# Patient Record
Sex: Male | Born: 1970 | Race: Black or African American | Hispanic: No | Marital: Married | Smoking: Current some day smoker
Health system: Southern US, Community
[De-identification: ages and names within clinical notes are randomized; demographics above are authoritative.]

## PROBLEM LIST (undated history)

## (undated) DIAGNOSIS — E119 Type 2 diabetes mellitus without complications: Secondary | ICD-10-CM

## (undated) DIAGNOSIS — K219 Gastro-esophageal reflux disease without esophagitis: Secondary | ICD-10-CM

## (undated) DIAGNOSIS — I10 Essential (primary) hypertension: Secondary | ICD-10-CM

## (undated) DIAGNOSIS — T7840XA Allergy, unspecified, initial encounter: Secondary | ICD-10-CM

## (undated) DIAGNOSIS — E785 Hyperlipidemia, unspecified: Secondary | ICD-10-CM

## (undated) DIAGNOSIS — I639 Cerebral infarction, unspecified: Secondary | ICD-10-CM

## (undated) DIAGNOSIS — I6381 Other cerebral infarction due to occlusion or stenosis of small artery: Secondary | ICD-10-CM

## (undated) HISTORY — PX: FRACTURE SURGERY: SHX138

## (undated) HISTORY — DX: Hyperlipidemia, unspecified: E78.5

## (undated) HISTORY — DX: Allergy, unspecified, initial encounter: T78.40XA

## (undated) HISTORY — PX: TOE SURGERY: SHX1073

## (undated) HISTORY — DX: Other cerebral infarction due to occlusion or stenosis of small artery: I63.81

## (undated) HISTORY — DX: Cerebral infarction, unspecified: I63.9

## (undated) HISTORY — DX: Gastro-esophageal reflux disease without esophagitis: K21.9

## (undated) HISTORY — PX: SHOULDER SURGERY: SHX246

---

## 2015-04-09 ENCOUNTER — Encounter (HOSPITAL_COMMUNITY): Payer: Self-pay | Admitting: Emergency Medicine

## 2015-04-09 ENCOUNTER — Emergency Department (INDEPENDENT_AMBULATORY_CARE_PROVIDER_SITE_OTHER)
Admission: EM | Admit: 2015-04-09 | Discharge: 2015-04-09 | Disposition: A | Discharge status: Home / Self Care | Payer: Self-pay | Source: Home / Self Care

## 2015-04-09 DIAGNOSIS — Z760 Encounter for issue of repeat prescription: Secondary | ICD-10-CM

## 2015-04-09 MED ORDER — METFORMIN HCL 1000 MG PO TABS: 1000.0000 mg | ORAL_TABLET | Freq: Two times a day (BID) | ORAL | Status: DC

## 2015-04-09 NOTE — ED Notes (Signed)
Reports pt has been out of DM meds x2 weeks Feeling a little bit "weak" CBG last night was 348  A&O x4... No acute distress.

## 2015-04-09 NOTE — Discharge Instructions (Signed)
[facility] to ask about diabetic program  Call family medicine clinic at [facility]

## 2016-07-13 ENCOUNTER — Ambulatory Visit (HOSPITAL_COMMUNITY)
Admission: EM | Admit: 2016-07-13 | Discharge: 2016-07-13 | Disposition: A | Discharge status: Home / Self Care | Payer: BLUE CROSS/BLUE SHIELD | Attending: Internal Medicine | Admitting: Internal Medicine

## 2016-07-13 ENCOUNTER — Encounter (HOSPITAL_COMMUNITY): Payer: Self-pay | Admitting: Emergency Medicine

## 2016-07-13 DIAGNOSIS — R739 Hyperglycemia, unspecified: Secondary | ICD-10-CM

## 2016-07-13 DIAGNOSIS — M25561 Pain in right knee: Secondary | ICD-10-CM

## 2016-07-13 HISTORY — DX: Type 2 diabetes mellitus without complications: E11.9

## 2016-07-13 LAB — GLUCOSE, CAPILLARY: GLUCOSE-CAPILLARY: 354 mg/dL — AB (ref 65–99)

## 2016-07-13 MED ORDER — METFORMIN HCL 1000 MG PO TABS: 1000.0000 mg | ORAL_TABLET | Freq: Two times a day (BID) | ORAL | 3 refills | Status: DC

## 2016-07-13 MED ORDER — INSULIN GLARGINE 100 UNIT/ML ~~LOC~~ SOLN: 10.0000 [IU] | Freq: Every day | SUBCUTANEOUS | 1 refills | Status: DC

## 2016-07-13 NOTE — ED Provider Notes (Signed)
CSN: [id number]     Arrival date & time 07/13/16  1830 History   None    Chief Complaint  Patient presents with  . Knee Pain  . Hyperglycemia   (Consider location/radiation/quality/duration/timing/severity/associated sxs/prior Treatment) The history is provided by the patient. No language interpreter was used.  Knee Pain  Location:  Knee Injury: no   Knee location:  R knee Pain details:    Quality:  Aching   Radiates to:  Does not radiate   Severity:  Moderate   Onset quality:  Gradual   Duration:  1 week   Timing:  Constant   Progression:  Worsening Chronicity:  New Relieved by:  Nothing Worsened by:  Nothing Ineffective treatments:  None tried Hyperglycemia   Pt complains of knee pain and of being out of diabetes medicine.  Past Medical History:  Diagnosis Date  . Diabetes mellitus without complication [facility])    Past Surgical History:  Procedure Laterality Date  . FRACTURE SURGERY    . SHOULDER SURGERY Right    History reviewed. No pertinent family history. Social History  Substance Use Topics  . Smoking status: Current Every Day Smoker    Packs/day: 1.00    Types: Cigarettes  . Smokeless tobacco: Never Used  . Alcohol use No    Review of Systems  Allergies  Patient has no known allergies.  Home Medications   Prior to Admission medications   Medication Sig Start Date End Date Taking? Authorizing Provider  insulin glargine (LANTUS) 100 UNIT/ML injection Inject 0.1 mLs (10 Units total) into the skin at bedtime. 07/13/16   Fransico Meadow, PA-C  metFORMIN (GLUCOPHAGE) 1000 MG tablet Take 1 tablet (1,000 mg total) by mouth 2 (two) times daily. 07/13/16   Fransico Meadow, PA-C   Meds Ordered and Administered this Visit  Medications - No data to display  BP 115/76 (BP Location: Right Arm)   Pulse 75   Temp 98.2 F (36.8 C) (Oral)   Resp 14   SpO2 99%  No data found.   Physical Exam  Urgent Care Course     Procedures (including critical care  time)  Labs Review Labs Reviewed  GLUCOSE, CAPILLARY - Abnormal; Notable for the following:       Result Value   Glucose-Capillary 354 (*)    All other components within normal limits    Imaging Review No results found.   Visual Acuity Review  Right Eye Distance:   Left Eye Distance:   Bilateral Distance:    Right Eye Near:   Left Eye Near:    Bilateral Near:        Pt has elevated glucose.  Rx's to pt pharmacy.   Tylenol for knee pain.  Pt advise to call number on discharge instructions for referral to primary care MDM   1. Acute pain of right knee   2. Hyperglycemia    Meds ordered this encounter  Medications  . insulin glargine (LANTUS) 100 UNIT/ML injection    Sig: Inject 0.1 mLs (10 Units total) into the skin at bedtime.    Dispense:  10 mL    Refill:  1    Order Specific Question:   Supervising Provider    Answer:   Sherlene Shams [[id number]]  . metFORMIN (GLUCOPHAGE) 1000 MG tablet    Sig: Take 1 tablet (1,000 mg total) by mouth 2 (two) times daily.    Dispense:  60 tablet    Refill:  3    Order  Specific Question:   Supervising Provider    Answer:   Sherlene Shams [[id number]]  An After Visit Summary was printed and given to the patient.     Evening Shade, PA-C 07/14/16 [phone]

## 2016-07-13 NOTE — Discharge Instructions (Signed)
Return if any problems. Schedule primary care appointment

## 2016-07-13 NOTE — ED Triage Notes (Addendum)
The patient presented to the [facility] with a complaint of left knee pain and elevated blood sugars. The patient denied any known injury to his knee but stated that he is on his feet a lot at work. The patient reported that he has not had his insulin or metformin in the last 3 months.

## 2016-08-19 ENCOUNTER — Ambulatory Visit: Payer: BLUE CROSS/BLUE SHIELD | Admitting: Family

## 2016-09-08 ENCOUNTER — Emergency Department (HOSPITAL_COMMUNITY)
Admission: EM | Admit: 2016-09-08 | Discharge: 2016-09-08 | Disposition: A | Discharge status: Home / Self Care | Payer: BLUE CROSS/BLUE SHIELD | Attending: Emergency Medicine | Admitting: Emergency Medicine

## 2016-09-08 ENCOUNTER — Emergency Department (HOSPITAL_COMMUNITY): Payer: BLUE CROSS/BLUE SHIELD

## 2016-09-08 ENCOUNTER — Encounter (HOSPITAL_COMMUNITY): Payer: Self-pay

## 2016-09-08 DIAGNOSIS — R109 Unspecified abdominal pain: Secondary | ICD-10-CM | POA: Diagnosis not present

## 2016-09-08 DIAGNOSIS — R1011 Right upper quadrant pain: Secondary | ICD-10-CM | POA: Insufficient documentation

## 2016-09-08 DIAGNOSIS — R1031 Right lower quadrant pain: Secondary | ICD-10-CM | POA: Insufficient documentation

## 2016-09-08 DIAGNOSIS — Z7984 Long term (current) use of oral hypoglycemic drugs: Secondary | ICD-10-CM | POA: Insufficient documentation

## 2016-09-08 DIAGNOSIS — F17210 Nicotine dependence, cigarettes, uncomplicated: Secondary | ICD-10-CM | POA: Insufficient documentation

## 2016-09-08 DIAGNOSIS — E119 Type 2 diabetes mellitus without complications: Secondary | ICD-10-CM | POA: Diagnosis not present

## 2016-09-08 LAB — URINALYSIS, ROUTINE W REFLEX MICROSCOPIC
BACTERIA UA: NONE SEEN
BILIRUBIN URINE: NEGATIVE
Hgb urine dipstick: NEGATIVE
KETONES UR: NEGATIVE mg/dL
Leukocytes, UA: NEGATIVE
NITRITE: NEGATIVE
PH: 5 (ref 5.0–8.0)
PROTEIN: NEGATIVE mg/dL
Specific Gravity, Urine: 1.032 — ABNORMAL HIGH (ref 1.005–1.030)

## 2016-09-08 LAB — COMPREHENSIVE METABOLIC PANEL
ALBUMIN: 3.8 g/dL (ref 3.5–5.0)
ALT: 13 U/L — ABNORMAL LOW (ref 17–63)
ANION GAP: 6 (ref 5–15)
AST: 16 U/L (ref 15–41)
Alkaline Phosphatase: 82 U/L (ref 38–126)
BILIRUBIN TOTAL: 0.5 mg/dL (ref 0.3–1.2)
BUN: 10 mg/dL (ref 6–20)
CHLORIDE: 104 mmol/L (ref 101–111)
CO2: 26 mmol/L (ref 22–32)
Calcium: 9.2 mg/dL (ref 8.9–10.3)
Creatinine, Ser: 0.85 mg/dL (ref 0.61–1.24)
GFR calc Af Amer: >60 mL/min (ref >60)
GFR calc non Af Amer: >60 mL/min (ref >60)
GLUCOSE: 268 mg/dL — AB (ref 65–99)
Potassium: 4.2 mmol/L (ref 3.5–5.1)
SODIUM: 136 mmol/L (ref 135–145)
TOTAL PROTEIN: 6.8 g/dL (ref 6.5–8.1)

## 2016-09-08 LAB — CBC WITH DIFFERENTIAL/PLATELET
BASOS ABS: 0 10*3/uL (ref 0.0–0.1)
Basophils Relative: 0 %
EOS ABS: 0.3 10*3/uL (ref 0.0–0.7)
EOS PCT: 3 %
HEMATOCRIT: 43.1 % (ref 39.0–52.0)
Hemoglobin: 15.1 g/dL (ref 13.0–17.0)
Lymphocytes Relative: 40 %
Lymphs Abs: 3.8 10*3/uL (ref 0.7–4.0)
MCH: 30.2 pg (ref 26.0–34.0)
MCHC: 35 g/dL (ref 30.0–36.0)
MCV: 86.2 fL (ref 78.0–100.0)
MONO ABS: 0.4 10*3/uL (ref 0.1–1.0)
MONOS PCT: 5 %
NEUTROS ABS: 5.1 10*3/uL (ref 1.7–7.7)
Neutrophils Relative %: 52 %
PLATELETS: 315 10*3/uL (ref 150–400)
RBC: 5 MIL/uL (ref 4.22–5.81)
RDW: 12.6 % (ref 11.5–15.5)
WBC: 9.7 10*3/uL (ref 4.0–10.5)

## 2016-09-08 MED ORDER — SODIUM CHLORIDE 0.9 % IV BOLUS (SEPSIS)
500.0000 mL | Freq: Once | INTRAVENOUS | Status: AC
  Administered 2016-09-08: 500 mL via INTRAVENOUS

## 2016-09-08 MED ORDER — SODIUM CHLORIDE 0.9 % IV BOLUS (SEPSIS): 1000.0000 mL | Freq: Once | INTRAVENOUS | Status: DC

## 2016-09-08 NOTE — ED Triage Notes (Signed)
Pt reports right flank pain that started last night. He denies any trouble urinating. He states the pain to his flank area subsides when he moves his genitals around. He also reports numbness to left hand which started today at 2pm.

## 2016-09-08 NOTE — Discharge Instructions (Signed)
As discussed, follow-up with your primary care provider at your upcoming appointment.  Your CT scan did not show any acute abnormalities today. Your exam and labs are reassuring. It is possible that he may have pulled a muscle. Take ibuprofen or naproxen for pain and do some gentle stretching. Return if symptoms worsen or if you experience new concerning symptoms in the meantime.

## 2016-09-08 NOTE — ED Notes (Signed)
Pt departed in NAD, refused use of wheelchair.  

## 2016-09-08 NOTE — ED Notes (Signed)
Patient transported to CT 

## 2016-09-08 NOTE — ED Provider Notes (Signed)
Barry Phillips Provider Note   CSN: [id number] Arrival date & time: 09/08/16  1749     History   Chief Complaint Chief Complaint  Patient presents with  . Flank Pain    HPI Barry Phillips is a 46 y.o. male presenting with sudden onset achy right flank pain starting yesterday at work that has been intermittent since. He has not tried anything for the pain. The pain is nonradiating, it is aggravated by movement. He states that he has gotten relief from moving his genitals at times. He does report a history of nephrolithiasis 18 years ago, but the pain was much more severe than it is today. He is currently pain-free unless he moves. He states that he does not have a PCP but will have one starting in July. He is currently noncompliant with insulin as he can't afford to purchase it. He states that he takes 2000 mg of metformin a day. Denies dysuria, hematuria, decreased urination, testicular pain, swelling, discharge, fever, chills, nausea, vomiting, diarrhea or other symptoms.  HPI  Past Medical History:  Diagnosis Date  . Diabetes mellitus without complication ([place])     There are no active problems to display for this patient.   Past Surgical History:  Procedure Laterality Date  . FRACTURE SURGERY    . SHOULDER SURGERY Right        Home Medications    Prior to Admission medications   Medication Sig Start Date End Date Taking? Authorizing Provider  aspirin 81 MG chewable tablet Chew 81 mg by mouth daily.   Yes [provider]  metFORMIN (GLUCOPHAGE) 1000 MG tablet Take 1 tablet (1,000 mg total) by mouth 2 (two) times daily. 07/13/16  Yes Caryl Ada K, PA-C  insulin glargine (LANTUS) 100 UNIT/ML injection Inject 0.1 mLs (10 Units total) into the skin at bedtime. Patient not taking: Reported on 09/08/2016 07/13/16   Barry Phillips    Family History No family history on file.  Social History Social History  Substance Use Topics  . Smoking status:  Current Every Day Smoker    Packs/day: 1.00    Types: Cigarettes  . Smokeless tobacco: Never Used  . Alcohol use No     Allergies   Patient has no known allergies.   Review of Systems Review of Systems  Constitutional: Negative for activity change, appetite change, chills and fever.  HENT: Negative for ear pain and sore throat.   Eyes: Negative for pain and visual disturbance.  Respiratory: Negative for cough, shortness of breath, wheezing and stridor.   Cardiovascular: Negative for chest pain and palpitations.  Gastrointestinal: Negative for abdominal distention, abdominal pain, blood in stool, diarrhea, nausea and vomiting.  Genitourinary: Positive for flank pain. Negative for decreased urine volume, difficulty urinating, discharge, dysuria, frequency, hematuria, penile pain, penile swelling, scrotal swelling and testicular pain.  Musculoskeletal: Negative for arthralgias, back pain, gait problem, neck pain and neck stiffness.  Skin: Negative for color change, pallor and rash.  Neurological: Negative for dizziness, seizures, syncope, weakness, light-headedness and headaches.     Physical Exam Updated Vital Signs BP 125/86   Pulse 69   Temp 97.8 F (36.6 C) (Oral)   Resp 16   SpO2 100%   Physical Exam  Constitutional: He appears well-developed and well-nourished. No distress.  Afebrile, nontoxic-appearing, lying comfortably in bed in no acute distress.  HENT:  Head: Normocephalic and atraumatic.  Eyes: Conjunctivae and EOM are normal. Right eye exhibits no discharge. Left eye exhibits no discharge.  No scleral icterus.  Neck: Normal range of motion.  Cardiovascular: Normal rate, regular rhythm and normal heart sounds.   No murmur heard. Pulmonary/Chest: Effort normal and breath sounds normal. No respiratory distress. He has no wheezes. He has no rales. He exhibits no tenderness.  Abdominal: Soft. He exhibits no distension and no mass. There is no tenderness. There is no  guarding.  No CVA tenderness. Flat contour, active bowel sounds. abdomen is supple and non-tender to both light and deep palpation and no rebound tenderness. Some reproducible TTP of right flank. No palpated masses. Negative murphy's sign Negative McBurney's point tenderness   Musculoskeletal: Normal range of motion. He exhibits no edema.  Neurological: He is alert.  Skin: Skin is warm and dry. No rash noted. He is not diaphoretic. No erythema. No pallor.  Psychiatric: He has a normal mood and affect.  Nursing note and vitals reviewed.    ED Treatments / Results  Labs (all labs ordered are listed, but only abnormal results are displayed) Labs Reviewed  URINALYSIS, ROUTINE W REFLEX MICROSCOPIC - Abnormal; Notable for the following:       Result Value   Specific Gravity, Urine 1.032 (*)    Glucose, UA >=500 (*)    Squamous Epithelial / LPF 0-5 (*)    All other components within normal limits  COMPREHENSIVE METABOLIC PANEL - Abnormal; Notable for the following:    Glucose, Bld 268 (*)    ALT 13 (*)    All other components within normal limits  CBC WITH DIFFERENTIAL/PLATELET    EKG  EKG Interpretation None       Radiology Ct Renal Stone Study  Result Date: 09/08/2016 CLINICAL DATA:  46 year old male with acute right flank and abdominal pain for 1 day. EXAM: CT ABDOMEN AND PELVIS WITHOUT CONTRAST TECHNIQUE: Multidetector CT imaging of the abdomen and pelvis was performed following the standard protocol without IV contrast. COMPARISON:  None. FINDINGS: Please note that parenchymal abnormalities may be missed without intravenous contrast. Lower chest: No acute abnormality Hepatobiliary: The liver and gallbladder are unremarkable. There is no evidence of biliary dilatation. Pancreas: Unremarkable Spleen: Unremarkable Adrenals/Urinary Tract: The kidneys, adrenal glands and bladder are unremarkable. Stomach/Bowel: Stomach is within normal limits. Appendix appears normal. No evidence of  bowel wall thickening, distention, or inflammatory changes. Vascular/Lymphatic: No significant vascular findings are present. No enlarged abdominal or pelvic lymph nodes. Reproductive: Prostate is unremarkable. Other: No ascites, focal collection or pneumoperitoneum. Musculoskeletal: No acute or significant osseous findings. IMPRESSION: No acute or significant abnormalities. Electronically Signed   By: Margarette Canada M.D.   On: 09/08/2016 22:12    Procedures Procedures (including critical care time)  Medications Ordered in ED Medications  sodium chloride 0.9 % bolus 500 mL (500 mLs Intravenous New Bag/Given 09/08/16 2242)     Initial Impression / Assessment and Plan / ED Course  I have reviewed the triage vital signs and the nursing notes.  Pertinent labs & imaging results that were available during my care of the patient were reviewed by me and considered in my medical decision making (see chart for details).    Patient presents with right flank pain. Known history of remote nephrolithiasis although pain is less severe at this time.  Suspect he could be experiencing ureter spasm from a small stone Ordered CT renal. Abdomen is otherwise benign. No associated symptoms.   Reassuring exam, no cva tenderness, afebrile, nontoxic.  He is non-compliant with insulin and his blood glucose is poorly managed, no ketones  or gap.  Will give him IV fluid bolus, and reassess.  On reassessment, patient was sleeping comfortably and reported feeling well.  CT without acute abnormalities. Discussed negative CT findings and urged to follow up with PCP for diabetes management.  Discussed strict return precautions and advised to return to the emergency department if experiencing any new or worsening symptoms. Instructions were understood and patient agreed with discharge plan. Final Clinical Impressions(s) / ED Diagnoses   Final diagnoses:  Right flank pain    New Prescriptions New Prescriptions   No  medications on file     Dossie Der 09/08/16 2252    Fredia Sorrow, MD 09/13/16 [phone]

## 2016-09-18 ENCOUNTER — Encounter: Payer: Self-pay | Admitting: Family

## 2016-09-18 ENCOUNTER — Other Ambulatory Visit (INDEPENDENT_AMBULATORY_CARE_PROVIDER_SITE_OTHER): Payer: BLUE CROSS/BLUE SHIELD

## 2016-09-18 ENCOUNTER — Ambulatory Visit (INDEPENDENT_AMBULATORY_CARE_PROVIDER_SITE_OTHER): Payer: BLUE CROSS/BLUE SHIELD | Admitting: Family

## 2016-09-18 VITALS — BP 122/86 | HR 86 | Temp 98.1°F | Resp 16 | Ht 75.0 in | Wt 202.0 lb

## 2016-09-18 DIAGNOSIS — E1142 Type 2 diabetes mellitus with diabetic polyneuropathy: Secondary | ICD-10-CM | POA: Diagnosis not present

## 2016-09-18 DIAGNOSIS — E119 Type 2 diabetes mellitus without complications: Secondary | ICD-10-CM | POA: Insufficient documentation

## 2016-09-18 DIAGNOSIS — D229 Melanocytic nevi, unspecified: Secondary | ICD-10-CM | POA: Diagnosis not present

## 2016-09-18 LAB — MICROALBUMIN / CREATININE URINE RATIO
Creatinine,U: 71.1 mg/dL
MICROALB/CREAT RATIO: 1 mg/g (ref 0.0–30.0)
Microalb, Ur: 0.7 mg/dL (ref 0.0–1.9)

## 2016-09-18 LAB — HEMOGLOBIN A1C: HEMOGLOBIN A1C: 14 % — AB (ref 4.6–6.5)

## 2016-09-18 MED ORDER — ERTUGLIFLOZIN L-PYROGLUTAMICAC 5 MG PO TABS: 5.0000 mg | ORAL_TABLET | Freq: Every day | ORAL | 0 refills | Status: DC

## 2016-09-18 MED ORDER — ERTUGLIFLOZIN L-PYROGLUTAMICAC 5 MG PO TABS: 1.0000 | ORAL_TABLET | Freq: Every day | ORAL | 2 refills | Status: DC

## 2016-09-18 NOTE — Patient Instructions (Signed)
Thank you for choosing [facility].  SUMMARY AND INSTRUCTIONS:  We will check your blood work today.   Please continue to take your metformin as prescribed.  Monitor your blood sugars at home.   Start the [employer]   Monitor the mole on your leg.   Follow up in 3 months or sooner.  Medication:  Your prescription(s) have been submitted to your pharmacy or been printed and provided for you. Please take as directed and contact our office if you believe you are having problem(s) with the medication(s) or have any questions.  Labs:  Please stop by the lab on the lower level of the building for your blood work. Your results will be released to [place] (or called to you) after review, usually within 72 hours after test completion. If any changes need to be made, you will be notified at that same time.  1.) The lab is open from 7:30am to 5:30 pm Monday-Friday 2.) No appointment is necessary 3.) Fasting (if needed) is 6-8 hours after food and drink; black coffee and water are okay   Follow up:  If your symptoms worsen or fail to improve, please contact our office for further instruction, or in case of emergency go directly to the emergency room at the closest medical facility.

## 2016-09-18 NOTE — Progress Notes (Signed)
Subjective:    Patient ID: Barry Phillips, male    DOB: May 19, 1970, 46 y.o.   MRN: [id number]  Chief Complaint  Patient presents with  . Establish Care    diabetes check and would like to see about cheaper insulin, spot on left leg     HPI:  Barry Phillips is a 46 y.o. male who  has a past medical history of Diabetes mellitus without complication ([place]). and presents today for an office visit to establish care.  1.) Type 2 diabetes - Currently prescribed metformin and Lantus. Reports Reports taking the metformin as prescribed and has been out of Lantus for the last year. No adverse side effects or hypoglycemic readings. Blood sugars at home have been ranging from 200-300. Denies new symptoms of end organ damage. Does have some numbness and tingling located in his feet. No excessive hunger, thirst, or urination. Not currently following a low carbohydrate. Last eye exam was about 1 year ago.   Lab Results  Component Value Date   HGBA1C 14.0 (H) 09/18/2016     Lab Results  Component Value Date   CREATININE 0.85 09/08/2016   BUN 10 09/08/2016   NA 136 09/08/2016   K 4.2 09/08/2016   CL 104 09/08/2016   CO2 26 09/08/2016    2.) Spot on leg - This is a new problem. Associated symptom of a lesion located on medial aspect of the left thigh that has been going on for about 10 years and has concerns that it recently changed in size. No pain or discharge from the site.     No Known Allergies    Outpatient Medications Prior to Visit  Medication Sig Dispense Refill  . aspirin 81 MG chewable tablet Chew 81 mg by mouth daily.    . metFORMIN (GLUCOPHAGE) 1000 MG tablet Take 1 tablet (1,000 mg total) by mouth 2 (two) times daily. 60 tablet 3  . insulin glargine (LANTUS) 100 UNIT/ML injection Inject 0.1 mLs (10 Units total) into the skin at bedtime. 10 mL 1   No facility-administered medications prior to visit.      Past Medical History:  Diagnosis Date  . Diabetes mellitus without  complication [facility])       Past Surgical History:  Procedure Laterality Date  . FRACTURE SURGERY    . SHOULDER SURGERY Right       Family History  Problem Relation Age of Onset  . Diabetes Father       Social History   Social History  . Marital status: Married    Spouse name: N/A  . Number of children: 3  . Years of education: GED   Occupational History  . Not on file.   Social History Main Topics  . Smoking status: Current Every Day Smoker    Packs/day: 1.00    Years: 19.00    Types: Cigarettes  . Smokeless tobacco: Never Used  . Alcohol use Yes     Comment: Social  . Drug use: No  . Sexual activity: Not on file   Other Topics Concern  . Not on file   Social History Narrative   Fun/Hobby: Play with his daughters and family time.     Review of Systems  Eyes:       Negative for changes in vision.  Respiratory: Negative for chest tightness and shortness of breath.   Cardiovascular: Negative for chest pain, palpitations and leg swelling.  Endocrine: Negative for polydipsia, polyphagia and polyuria.  Neurological: Negative for dizziness,  weakness, light-headedness and headaches.       Objective:    BP 122/86 (BP Location: Left Arm, Patient Position: Sitting, Cuff Size: Large)   Pulse 86   Temp 98.1 F (36.7 C) (Oral)   Resp 16   Ht 6\' 3"  (1.905 m)   Wt 202 lb (91.6 kg)   SpO2 98%   BMI 25.25 kg/m  Nursing note and vital signs reviewed.  Physical Exam  Constitutional: He is oriented to person, place, and time. He appears well-developed and well-nourished. No distress.  Cardiovascular: Normal rate, regular rhythm, normal heart sounds and intact distal pulses.   Pulmonary/Chest: Effort normal and breath sounds normal.  Neurological: He is alert and oriented to person, place, and time.  Diabetic Foot Exam - Simple   Simple Foot Form Diabetic Foot exam was performed with the following findings:  Yes  09/18/2016  9:54 AM  Visual Inspection No  deformities, no ulcerations, no other skin breakdown bilaterally:  Yes Sensation Testing See comments:  Yes Pulse Check Posterior Tibialis and Dorsalis pulse intact bilaterally:  Yes Comments Significant decreased sensation on the plantar aspect of the feet  bilaterally to monofilament. Intact to light touch.    Skin: Skin is warm and dry.  Small brown and annular nevi located on the left inner thigh with distinct borders and no tenderness.   Psychiatric: He has a normal mood and affect. His behavior is normal. Judgment and thought content normal.        Assessment & Plan:   Problem List Items Addressed This Visit      Endocrine   Type 2 diabetes mellitus ([place]) - Primary    Obtain hemoglobin A1c and urine microalbumin. Continue current dosage of metformin. Start Steglatro and discontinue Lantus. Encouraged to monitor blood sugars at home 1-2 times daily. Diabetic foot exam completed today. Encouraged to complete diabetic eye exam independently. Not currently maintained on statin/Ace or ARB for CAD risk reduction. Encouraged to follow a carbohydrate modify diet. Continue to monitor pending A1c results.      Relevant Medications   Ertugliflozin L-PyroglutamicAc (STEGLATRO) 5 MG TABS   Ertugliflozin L-PyroglutamicAc (STEGLATRO) 5 MG TABS   Other Relevant Orders   Hemoglobin A1c (Completed)   Urine Microalbumin w/creat. ratio (Completed)     Musculoskeletal and Integument   Benign skin mole    Benign skin mole noted on left inner thigh with little concern for malignancy. Continue to monitor and consider biopsy if changing in size.          I have discontinued Mr. Funchess's insulin glargine. I am also having him start on Ertugliflozin L-PyroglutamicAc and Ertugliflozin L-PyroglutamicAc. Additionally, I am having him maintain his metFORMIN and aspirin.   Meds ordered this encounter  Medications  . Ertugliflozin L-PyroglutamicAc (STEGLATRO) 5 MG TABS    Sig: Take 5 mg by mouth  daily.    Dispense:  14 tablet    Refill:  0    Order Specific Question:   Supervising Provider    Answer:   Pricilla Holm A [[id number]]  . Ertugliflozin L-PyroglutamicAc (STEGLATRO) 5 MG TABS    Sig: Take 1 tablet by mouth daily.    Dispense:  30 tablet    Refill:  2    Order Specific Question:   Supervising Provider    Answer:   Pricilla Holm A [[id number]]     Follow-up: Return in about 3 months (around 12/19/2016).  Mauricio Po, FNP

## 2016-09-18 NOTE — Assessment & Plan Note (Signed)
Benign skin mole noted on left inner thigh with little concern for malignancy. Continue to monitor and consider biopsy if changing in size.

## 2016-09-18 NOTE — Assessment & Plan Note (Signed)
Obtain hemoglobin A1c and urine microalbumin. Continue current dosage of metformin. Start Steglatro and discontinue Lantus. Encouraged to monitor blood sugars at home 1-2 times daily. Diabetic foot exam completed today. Encouraged to complete diabetic eye exam independently. Not currently maintained on statin/Ace or ARB for CAD risk reduction. Encouraged to follow a carbohydrate modify diet. Continue to monitor pending A1c results.

## 2017-01-29 ENCOUNTER — Ambulatory Visit: Payer: BLUE CROSS/BLUE SHIELD | Admitting: Family Medicine

## 2017-02-19 ENCOUNTER — Encounter: Payer: Self-pay | Admitting: Family Medicine

## 2017-02-19 ENCOUNTER — Ambulatory Visit: Payer: BLUE CROSS/BLUE SHIELD | Admitting: Family Medicine

## 2017-02-19 VITALS — BP 142/84 | HR 80 | Temp 98.6°F | Ht 75.0 in | Wt 208.0 lb

## 2017-02-19 DIAGNOSIS — Z794 Long term (current) use of insulin: Secondary | ICD-10-CM | POA: Diagnosis not present

## 2017-02-19 DIAGNOSIS — M2142 Flat foot [pes planus] (acquired), left foot: Secondary | ICD-10-CM

## 2017-02-19 DIAGNOSIS — M2140 Flat foot [pes planus] (acquired), unspecified foot: Secondary | ICD-10-CM | POA: Insufficient documentation

## 2017-02-19 DIAGNOSIS — E1142 Type 2 diabetes mellitus with diabetic polyneuropathy: Secondary | ICD-10-CM | POA: Diagnosis not present

## 2017-02-19 DIAGNOSIS — M2141 Flat foot [pes planus] (acquired), right foot: Secondary | ICD-10-CM

## 2017-02-19 DIAGNOSIS — G629 Polyneuropathy, unspecified: Secondary | ICD-10-CM | POA: Insufficient documentation

## 2017-02-19 LAB — HEMOGLOBIN A1C: Hgb A1c MFr Bld: 13.2 % — ABNORMAL HIGH (ref 4.6–6.5)

## 2017-02-19 MED ORDER — GABAPENTIN 100 MG PO CAPS: 100.0000 mg | ORAL_CAPSULE | Freq: Three times a day (TID) | ORAL | 3 refills | Status: DC

## 2017-02-19 NOTE — Assessment & Plan Note (Signed)
Would likely benefit from orthosis. Would avoid anything rigid as to avoid any ulcer formation with his neuropathy. - Counseled on which over-the-counter orthotics to try. - Could consider custom orthotics

## 2017-02-19 NOTE — Patient Instructions (Signed)
Thank you for coming in,   Please take the gabapentin one time daily. If you tolerate this medication then you can increase it to three time per day.   We will call you with the results from today.    Please feel free to call with any questions or concerns at any time, at [phone]. --Dr. Raeford Razor  Diet Recommendations for Diabetes   Starchy (carb) foods include: Bread, rice, pasta, potatoes, corn, crackers, bagels, muffins, all baked goods.   Protein foods include: Meat, fish, poultry, eggs, dairy foods, and beans such as pinto and kidney beans (beans also provide carbohydrate).   1. Eat at least 3 meals and 1-2 snacks per day. Never go more than 4-5 hours while awake without eating.  2. Limit starchy foods to TWO per meal and ONE per snack. ONE portion of a starchy  food is equal to the following:   - ONE slice of bread (or its equivalent, such as half of a hamburger bun).   - 1/2 cup of a "scoopable" starchy food such as potatoes or rice.   - 1 OUNCE (28 grams) of starchy snack foods such as crackers or pretzels (look on label).   - 15 grams of carbohydrate as shown on food label.  3. Both lunch and dinner should include a protein food, a carb food, and vegetables.   - Obtain twice as many veg's as protein or carbohydrate foods for both lunch and dinner.   - Try to keep frozen veg's on hand for a quick vegetable serving.     - Fresh or frozen veg's are best.  4. Breakfast should always include protein.

## 2017-02-19 NOTE — Assessment & Plan Note (Addendum)
Currently uncontrolled. Is only currently taking metformin. Reports having intolerance to [facility] Has been diagnosed since 2008. - Referral to endocrinology. - Referral to diabetic education - Hemoglobin A1c today. - Counseled on diabetic diet

## 2017-02-19 NOTE — Assessment & Plan Note (Signed)
Likely related to his uncontrolled diabetes. - Counseled on blood sugar management - Gabapentin initiated

## 2017-02-19 NOTE — Progress Notes (Signed)
Barry Phillips - 46 y.o. male MRN [id number]  Date of birth: 10-04-1970  SUBJECTIVE:  Including CC & ROS.  Chief Complaint  Patient presents with  . Foot Pain    bilateral    Barry Phillips is a 46 y.o. male that is  Here for an evaluation bilateral feet pain. He states his left foot is worse than his right. The pain is located on the medial aspect of his left foot. He has constant numbness. He is up on his feet a lot and standing on concrete flooring at work.  Denies injury or prior surgeries to the area. He has not taken or tried anything for the pain.  Reports he has always had flat feet but they seem to be getting worse. He has to stand for long periods of time which seemed to make his pain worse. The pain is a throbbing and stinging sensation. It is mild to moderate in nature. He has not had any injury or inciting event. Pain seems to be staying the same. The numbness seems to be getting worse.  Reports being diagnosed with diabetes 2008. He was taking Steglatro, but reports having erectile dysfunction type symptoms. Is currently taking metformin. Was unable to continue insulin due to financial considerations.   Review his hemoglobin A1c from July of this sheers shows no elevation at 14.   Review of Systems  Constitutional: Negative for fever.  Respiratory: Negative for shortness of breath.   Cardiovascular: Negative for chest pain.  Musculoskeletal: Negative for gait problem.  Skin: Negative for color change.  Neurological: Positive for numbness. Negative for weakness.  Hematological: Negative for adenopathy.    HISTORY: Past Medical, Surgical, Social, and Family History Reviewed & Updated per EMR.   Pertinent Historical Findings include:  Past Medical History:  Diagnosis Date  . Diabetes mellitus without complication [facility])     Past Surgical History:  Procedure Laterality Date  . FRACTURE SURGERY    . SHOULDER SURGERY Right     No Known Allergies  Family History    Problem Relation Age of Onset  . Diabetes Father      Social History   Socioeconomic History  . Marital status: Married    Spouse name: Not on file  . Number of children: 3  . Years of education: GED  . Highest education level: Not on file  Social Needs  . Financial resource strain: Not on file  . Food insecurity - worry: Not on file  . Food insecurity - inability: Not on file  . Transportation needs - medical: Not on file  . Transportation needs - non-medical: Not on file  Occupational History  . Not on file  Tobacco Use  . Smoking status: Current Every Day Smoker    Packs/day: 1.00    Years: 19.00    Pack years: 19.00    Types: Cigarettes  . Smokeless tobacco: Never Used  Substance and Sexual Activity  . Alcohol use: Yes    Comment: Social  . Drug use: No  . Sexual activity: Not on file  Other Topics Concern  . Not on file  Social History Narrative   Fun/Hobby: Play with his daughters and family time.      PHYSICAL EXAM:  VS: BP (!) 142/84 (BP Location: Left Arm, Patient Position: Sitting, Cuff Size: Normal)   Pulse 80   Temp 98.6 F (37 C) (Oral)   Ht 6\' 3"  (1.905 m)   Wt 208 lb (94.3 kg)   SpO2 100%  BMI 26.00 kg/m  Physical Exam Gen: NAD, alert, cooperative with exam, well-appearing ENT: normal lips, normal nasal mucosa,  Eye: normal EOM, normal conjunctiva and lids CV:  no edema, +2 pedal pulses   Resp: no accessory muscle use, non-labored,  Skin: no rashes, no areas of induration  Neuro: normal tone, normal sensation to touch Psych:  normal insight, alert and oriented MSK:  Right and left foot. Significant pes planus bilaterally. Possible transverse arch with hammertoe formation. No ulcers on the plantar aspect. Walks on the medial aspect of his feet.  Monofilament testing Some loss of sensation over the left lateral aspect of the great toe and fourth digit. Some loss of sensation over the lateral forefoot on the left foot  Right foot  with more preserved sensation in all areas compared to the left     ASSESSMENT & PLAN:   Type 2 diabetes mellitus ([place]) Currently uncontrolled. Is only currently taking metformin. Reports having intolerance to [facility]. Has been diagnosed since 2008. - Referral to endocrinology. - Referral to diabetic education - Hemoglobin A1c today. - Counseled on diabetic diet   Polyneuropathy Likely related to his uncontrolled diabetes. - Counseled on blood sugar management - Gabapentin initiated  Flat foot Would likely benefit from orthosis. Would avoid anything rigid as to avoid any ulcer formation with his neuropathy. - Counseled on which over-the-counter orthotics to try. - Could consider custom orthotics

## 2017-02-24 ENCOUNTER — Telehealth: Payer: Self-pay | Admitting: Family Medicine

## 2017-02-24 NOTE — Telephone Encounter (Signed)
Left VM for patient. If he calls back please have him speak with a nurse/CMA and inform that his A1c is still elevated and we would need to use some form of insulin in order to bring it down.   If any questions then please take the best time and phone number to call and I will try to call him back.   Rosemarie Ax, MD [facility] 02/24/2017, 9:59 AM

## 2017-03-19 ENCOUNTER — Ambulatory Visit: Payer: BLUE CROSS/BLUE SHIELD | Admitting: *Deleted

## 2017-04-01 ENCOUNTER — Emergency Department (HOSPITAL_COMMUNITY)
Admission: EM | Admit: 2017-04-01 | Discharge: 2017-04-01 | Disposition: A | Discharge status: Home / Self Care | Payer: BLUE CROSS/BLUE SHIELD | Attending: Emergency Medicine | Admitting: Emergency Medicine

## 2017-04-01 ENCOUNTER — Other Ambulatory Visit: Payer: Self-pay

## 2017-04-01 ENCOUNTER — Encounter (HOSPITAL_COMMUNITY): Payer: Self-pay | Admitting: Emergency Medicine

## 2017-04-01 DIAGNOSIS — E1165 Type 2 diabetes mellitus with hyperglycemia: Secondary | ICD-10-CM | POA: Diagnosis not present

## 2017-04-01 DIAGNOSIS — Z7982 Long term (current) use of aspirin: Secondary | ICD-10-CM | POA: Diagnosis not present

## 2017-04-01 DIAGNOSIS — Z9114 Patient's other noncompliance with medication regimen: Secondary | ICD-10-CM | POA: Diagnosis not present

## 2017-04-01 DIAGNOSIS — K0889 Other specified disorders of teeth and supporting structures: Secondary | ICD-10-CM

## 2017-04-01 DIAGNOSIS — Z7984 Long term (current) use of oral hypoglycemic drugs: Secondary | ICD-10-CM | POA: Insufficient documentation

## 2017-04-01 DIAGNOSIS — K089 Disorder of teeth and supporting structures, unspecified: Secondary | ICD-10-CM | POA: Diagnosis not present

## 2017-04-01 DIAGNOSIS — Z79899 Other long term (current) drug therapy: Secondary | ICD-10-CM | POA: Insufficient documentation

## 2017-04-01 DIAGNOSIS — R350 Frequency of micturition: Secondary | ICD-10-CM | POA: Diagnosis not present

## 2017-04-01 DIAGNOSIS — F17210 Nicotine dependence, cigarettes, uncomplicated: Secondary | ICD-10-CM | POA: Diagnosis not present

## 2017-04-01 DIAGNOSIS — R631 Polydipsia: Secondary | ICD-10-CM | POA: Diagnosis not present

## 2017-04-01 DIAGNOSIS — R739 Hyperglycemia, unspecified: Secondary | ICD-10-CM | POA: Diagnosis not present

## 2017-04-01 LAB — URINALYSIS, COMPLETE (UACMP) WITH MICROSCOPIC
BACTERIA UA: NONE SEEN
BILIRUBIN URINE: NEGATIVE
Glucose, UA: >=500 mg/dL — AB
Hgb urine dipstick: NEGATIVE
KETONES UR: NEGATIVE mg/dL
LEUKOCYTES UA: NEGATIVE
NITRITE: NEGATIVE
PH: 5 (ref 5.0–8.0)
Protein, ur: NEGATIVE mg/dL
RBC / HPF: NONE SEEN RBC/hpf (ref 0–5)
Specific Gravity, Urine: 1.03 (ref 1.005–1.030)
Squamous Epithelial / LPF: NONE SEEN

## 2017-04-01 LAB — COMPREHENSIVE METABOLIC PANEL
ALT: 24 U/L (ref 17–63)
ANION GAP: 10 (ref 5–15)
AST: 20 U/L (ref 15–41)
Albumin: 3.8 g/dL (ref 3.5–5.0)
Alkaline Phosphatase: 105 U/L (ref 38–126)
BUN: 15 mg/dL (ref 6–20)
CALCIUM: 9.4 mg/dL (ref 8.9–10.3)
CHLORIDE: 100 mmol/L — AB (ref 101–111)
CO2: 24 mmol/L (ref 22–32)
Creatinine, Ser: 1.03 mg/dL (ref 0.61–1.24)
GFR calc Af Amer: >60 mL/min (ref >60)
GFR calc non Af Amer: >60 mL/min (ref >60)
Glucose, Bld: 562 mg/dL (ref 65–99)
POTASSIUM: 4.7 mmol/L (ref 3.5–5.1)
SODIUM: 134 mmol/L — AB (ref 135–145)
Total Bilirubin: 1 mg/dL (ref 0.3–1.2)
Total Protein: 7 g/dL (ref 6.5–8.1)

## 2017-04-01 LAB — CBC WITH DIFFERENTIAL/PLATELET
Basophils Absolute: 0 10*3/uL (ref 0.0–0.1)
Basophils Relative: 0 %
EOS ABS: 0.2 10*3/uL (ref 0.0–0.7)
EOS PCT: 2 %
HCT: 43.4 % (ref 39.0–52.0)
Hemoglobin: 15.3 g/dL (ref 13.0–17.0)
LYMPHS ABS: 3.1 10*3/uL (ref 0.7–4.0)
Lymphocytes Relative: 35 %
MCH: 30.2 pg (ref 26.0–34.0)
MCHC: 35.3 g/dL (ref 30.0–36.0)
MCV: 85.8 fL (ref 78.0–100.0)
MONOS PCT: 5 %
Monocytes Absolute: 0.4 10*3/uL (ref 0.1–1.0)
Neutro Abs: 5.1 10*3/uL (ref 1.7–7.7)
Neutrophils Relative %: 58 %
PLATELETS: 271 10*3/uL (ref 150–400)
RBC: 5.06 MIL/uL (ref 4.22–5.81)
RDW: 12.5 % (ref 11.5–15.5)
WBC: 8.9 10*3/uL (ref 4.0–10.5)

## 2017-04-01 LAB — CBG MONITORING, ED
Glucose-Capillary: 464 mg/dL — ABNORMAL HIGH (ref 65–99)
Glucose-Capillary: 340 mg/dL — ABNORMAL HIGH (ref 65–99)

## 2017-04-01 MED ORDER — PENICILLIN V POTASSIUM 500 MG PO TABS: 500.0000 mg | ORAL_TABLET | Freq: Four times a day (QID) | ORAL | 0 refills | Status: AC

## 2017-04-01 MED ORDER — SODIUM CHLORIDE 0.9 % IV BOLUS (SEPSIS)
1000.0000 mL | Freq: Once | INTRAVENOUS | Status: AC
  Administered 2017-04-01: 1000 mL via INTRAVENOUS

## 2017-04-01 MED ORDER — METFORMIN HCL 1000 MG PO TABS
1000.0000 mg | ORAL_TABLET | Freq: Two times a day (BID) | ORAL | 0 refills | Status: DC
Start: 2017-04-01 — End: 2017-05-14

## 2017-04-01 NOTE — ED Triage Notes (Addendum)
Patient ran out of Metformin 1g tab for 2 weeks ( needs prescription) and left upper molar pain onset last week unrelieved by OTC Advil . Respirations unlabored , no nausea or fever.

## 2017-04-01 NOTE — Discharge Instructions (Addendum)
You have a dental infection. It is very important that you get evaluated by a dentist as soon as possible. Call tomorrow to schedule an appointment. Ibuprofen as needed for pain. Take your full course of antibiotics. Read the instructions below.  Eat a soft or liquid diet and rinse your mouth out after meals with warm water. You should see a dentist or return here at once if you have increased swelling, increased pain or uncontrolled bleeding from the site of your injury.  SEEK MEDICAL CARE IF:  You have increased pain not controlled with medicines.  You have swelling around your tooth, in your face or neck.  You have bleeding which starts, continues, or gets worse.  You have a fever >101 If you are unable to open your mouth

## 2017-04-01 NOTE — ED Provider Notes (Signed)
Barry Phillips Note   CSN: [id number] Arrival date & time: 04/01/17  1923     History   Chief Complaint Chief Complaint  Patient presents with  . Hyperglycemia    CBG= 464  . Dental Pain    HPI Barry Phillips is a 47 y.o. male.  The history is provided by the patient and medical records. No language interpreter was used.  Hyperglycemia  Associated symptoms: increased thirst and polyuria   Dental Pain       Barry Phillips is a 47 y.o. male  with a PMH of DM2 who presents to the Emergency Department complaining of persistent, gradually worsening, left-sided, upper dental pain beginning 1 week ago. Worse with cold. Ibuprofen at home with little relief. They are not currently followed by dentistry.  Pt denies facial swelling, fever, chills, difficulty breathing, difficulty swallowing.   He additionally reports increased urination and thirst. Believes sugar is elevated. He has been out of Metformin for two weeks. He also reports being on insulin (not sure of what kind of insulin), but has not taken this in several months as he could not afford the prescription. His primary doctor has retired and he has not found a new PCP in the area yet. No fever, chills, n/v, abdominal pain.   Past Medical History:  Diagnosis Date  . Diabetes mellitus without complication Barry Phillips)     Patient Active Problem List   Diagnosis Date Noted  . Flat foot 02/19/2017  . Polyneuropathy 02/19/2017  . Type 2 diabetes mellitus (Barry Phillips) 09/18/2016  . Benign skin mole 09/18/2016    Past Surgical History:  Procedure Laterality Date  . FRACTURE SURGERY    . SHOULDER SURGERY Right        Home Medications    Prior to Admission medications   Medication Sig Start Date End Date Taking? Authorizing Phillips  aspirin 81 MG chewable tablet Chew 81 mg by mouth daily.    Phillips, Historical, MD  gabapentin (NEURONTIN) 100 MG capsule Take 1 capsule (100 mg total) by  mouth 3 (three) times daily. 02/19/17   Barry Ax, MD  metFORMIN (GLUCOPHAGE) 1000 MG tablet Take 1 tablet (1,000 mg total) by mouth 2 (two) times daily. 04/01/17   Barry Phillips, Barry Almond, PA-C  penicillin v potassium (VEETID) 500 MG tablet Take 1 tablet (500 mg total) by mouth 4 (four) times daily for 7 days. 04/01/17 04/08/17  Barry Phillips, Barry Almond, PA-C    Family History Family History  Problem Relation Age of Onset  . Diabetes Father     Social History Social History   Tobacco Use  . Smoking status: Current Every Day Smoker    Packs/day: 1.00    Years: 19.00    Pack years: 19.00    Types: Cigarettes  . Smokeless tobacco: Never Used  Substance Use Topics  . Alcohol use: Yes    Comment: Social  . Drug use: No     Allergies   Patient has no known allergies.   Review of Systems Review of Systems  HENT: Positive for dental problem. Negative for congestion, sore throat and trouble swallowing.   Endocrine: Positive for polydipsia and polyuria.  All other systems reviewed and are negative.    Physical Exam Updated Vital Signs BP 119/80 (BP Location: Left Arm)   Pulse 68   Temp 97.7 F (36.5 C) (Oral)   Resp 16   SpO2 100%   Physical Exam  Constitutional: He is oriented to person,  place, and time. He appears well-developed and well-nourished. No distress.  HENT:  Head: Normocephalic and atraumatic.  Mouth/Throat:    Dental cavities and poor oral dentition noted. Pain along tooth as depicted in image. No abscess noted. Midline uvula. No trismus. OP moist and clear. No oropharyngeal erythema or edema. Neck supple with no tenderness. No facial edema.  Cardiovascular: Normal rate, regular rhythm and normal heart sounds.  No murmur heard. Pulmonary/Chest: Effort normal and breath sounds normal. No respiratory distress.  Abdominal: Soft. Bowel sounds are normal. He exhibits no distension.  No abdominal tenderness.  Musculoskeletal: He exhibits no edema.    Neurological: He is alert and oriented to person, place, and time.  Skin: Skin is warm and dry.  Nursing note and vitals reviewed.    ED Treatments / Results  Labs (all labs ordered are listed, but only abnormal results are displayed) Labs Reviewed  COMPREHENSIVE METABOLIC PANEL - Abnormal; Notable for the following components:      Result Value   Sodium 134 (*)    Chloride 100 (*)    Glucose, Bld 562 (*)    All other components within normal limits  URINALYSIS, COMPLETE (UACMP) WITH MICROSCOPIC - Abnormal; Notable for the following components:   Color, Urine STRAW (*)    Glucose, UA >=500 (*)    All other components within normal limits  CBG MONITORING, ED - Abnormal; Notable for the following components:   Glucose-Capillary 464 (*)    All other components within normal limits  CBG MONITORING, ED - Abnormal; Notable for the following components:   Glucose-Capillary 340 (*)    All other components within normal limits  CBC WITH DIFFERENTIAL/PLATELET    EKG  EKG Interpretation None       Radiology No results found.  Procedures Procedures (including critical care time)  Medications Ordered in ED Medications  sodium chloride 0.9 % bolus 1,000 mL (1,000 mLs Intravenous New Bag/Given 04/01/17 2134)  sodium chloride 0.9 % bolus 1,000 mL (1,000 mLs Intravenous New Bag/Given 04/01/17 2137)     Initial Impression / Assessment and Plan / ED Course  I have reviewed the triage vital signs and the nursing notes.  Pertinent labs & imaging results that were available during my care of the patient were reviewed by me and considered in my medical decision making (see chart for details).    Barry Phillips is a 47 y.o. male who presents to ED for two complaints:  1. Dental pain. No abscess requiring immediate incision and drainage. Patient is afebrile, non toxic appearing, and swallowing secretions well. Exam not concerning for Ludwig's angina or pharyngeal abscess. Will treat  with PenVK. I provided dental resource guide and stressed the importance of dental follow up for ultimate management of dental pain. Patient voices understanding and is agreeable to plan.  2. Hyperglycemia, medication non-compliance. Has been out of metformin for two weeks. CMP with glucose of 562. CO2 and anion gap normal. No evidence of DKA. 2L fluids given and CBG down to 340. Will refill metformin. Given [facility] referral.   Reasons to return to ER discussed and all questions answered.   Final Clinical Impressions(s) / ED Diagnoses   Final diagnoses:  Hyperglycemia  Pain, dental    ED Discharge Orders        Ordered    metFORMIN (GLUCOPHAGE) 1000 MG tablet  2 times daily     04/01/17 2252    penicillin v potassium (VEETID) 500 MG tablet  4 times daily  04/01/17 2252       Jamaria Amborn, Barry Almond, PA-C 04/01/17 2258    Noemi Chapel, MD 04/02/17 1501

## 2017-04-01 NOTE — ED Notes (Signed)
Checked CBG 340 RN Minna Merritts informed

## 2017-04-02 ENCOUNTER — Encounter: Payer: BLUE CROSS/BLUE SHIELD | Attending: Family | Admitting: *Deleted

## 2017-04-02 DIAGNOSIS — E1142 Type 2 diabetes mellitus with diabetic polyneuropathy: Secondary | ICD-10-CM | POA: Insufficient documentation

## 2017-04-02 DIAGNOSIS — Z794 Long term (current) use of insulin: Secondary | ICD-10-CM | POA: Diagnosis not present

## 2017-04-02 DIAGNOSIS — Z713 Dietary counseling and surveillance: Secondary | ICD-10-CM | POA: Diagnosis not present

## 2017-04-06 NOTE — Progress Notes (Signed)
Diabetes Self-Management Education  Visit Type: First/Initial  Appt. Start Time: 1100 Appt. End Time: 1230  04/06/2017  [name], identified by name and date of birth, is a 47 y.o. male with a diagnosis of Diabetes: Type 2. He is here with his wife, Barry Phillips, who participated in the visit. He states his normal weight is 245 pounds, and he has had gradual weight loss of almost 50 pounds over past year. He states he does not test BG typically because the numbers are always very high and he has no way to bring them down without a correction dose of insulin, which he does not have. He works in a [employer] and is on his feet from 6 PM to 6 AM every work day.   ASSESSMENT  Height 6\' 3"  (1.905 m), weight 203 lb 9.6 oz (92.4 kg). Body mass index is 25.45 kg/m.  Diabetes Self-Management Education - 04/02/17 0942      Visit Information   Visit Type  First/Initial      Initial Visit   Diabetes Type  Type 2    Are you currently following a meal plan?  No    Are you taking your medications as prescribed?  Yes    Date Diagnosed  2008      Health Coping   How would you rate your overall health?  Fair      Psychosocial Assessment   Patient Belief/Attitude about Diabetes  Motivated to manage diabetes    Self-care barriers  None    Self-management support  Family    Other persons present  Patient;Spouse/SO    Patient Concerns  Nutrition/Meal planning;Glycemic Control    Special Needs  None    Preferred Learning Style  No preference indicated    Learning Readiness  Change in progress    How often do you need to have someone help you when you read instructions, pamphlets, or other written materials from your doctor or pharmacy?  1 - Never    What is the last grade level you completed in school?  12      Pre-Education Assessment   Patient understands the diabetes disease and treatment process.  Needs Instruction    Patient understands incorporating nutritional management into  lifestyle.  Needs Instruction    Patient undertands incorporating physical activity into lifestyle.  Needs Instruction    Patient understands using medications safely.  Needs Instruction    Patient understands monitoring blood glucose, interpreting and using results  Needs Instruction    Patient understands prevention, detection, and treatment of acute complications.  Needs Instruction    Patient understands prevention, detection, and treatment of chronic complications.  Needs Instruction    Patient understands how to develop strategies to address psychosocial issues.  Needs Instruction    Patient understands how to develop strategies to promote health/change behavior.  Needs Instruction      Complications   Last HgB A1C per patient/outside source  13.2 % tests less than once a week  because numbers are always too high and he has no informaiton on how to bring it down.    How often do you check your blood sugar?  0 times/day (not testing) very frustrated with high numbers that he can't do anything about without a fast acting insulin  and sliding scale information    Fasting Blood glucose range (mg/dL)  >200    Postprandial Blood glucose range (mg/dL)  >200    Number of hypoglycemic episodes per month  0  Have you had a dilated eye exam in the past 12 months?  No    Have you had a dental exam in the past 12 months?  No    Are you checking your feet?  Yes    How many days per week are you checking your feet?  7      Dietary Intake   Breakfast  oatmeal OR 3 eggs 2 bacon, 2 biscuits    Snack (morning)  fresh fruit, honey bun occasionally, chips, nuts, PNB crackers    Lunch  skips if home, may eat at work on first break at 1PM  sandwich or fruit before work, then [employer] (afternoon)  same as AM    Dinner  at work at [employer], starch, vegetables, occasionally bread or salad    Snack (evening)  same as AM    Beverage(s)  water, coffee with creamer and Splenda, OJ @ 8 oz or  more, diet soda      Exercise   Exercise Type  Light (walking / raking leaves)    How many days per week to you exercise?  5    How many minutes per day do you exercise?  60    Total minutes per week of exercise  300      Patient Education   Previous Diabetes Education  No    Disease state   Definition of diabetes, type 1 and 2, and the diagnosis of diabetes;Factors that contribute to the development of diabetes;Explored patient's options for treatment of their diabetes    Nutrition management   Role of diet in the treatment of diabetes and the relationship between the three main macronutrients and blood glucose level;Food label reading, portion sizes and measuring food.;Carbohydrate counting;Reviewed blood glucose goals for pre and post meals and how to evaluate the patients' food intake on their blood glucose level.    Physical activity and exercise   Role of exercise on diabetes management, blood pressure control and cardiac health.    Medications  Reviewed patients medication for diabetes, action, purpose, timing of dose and side effects.    Monitoring  Purpose and frequency of SMBG.;Identified appropriate SMBG and/or A1C goals.    Acute complications  Discussed and identified patients' treatment of hyperglycemia.    Chronic complications  Relationship between chronic complications and blood glucose control    Psychosocial adjustment  Worked with patient to identify barriers to care and solutions;Role of stress on diabetes;Helped patient identify a support system for diabetes management      Individualized Goals (developed by patient)   Nutrition  Follow meal plan discussed    Physical Activity  Exercise 3-5 times per week    Medications  take my medication as prescribed    Monitoring   test blood glucose pre and post meals as discussed      Post-Education Assessment   Patient understands the diabetes disease and treatment process.  Demonstrates understanding / competency    Patient  understands incorporating nutritional management into lifestyle.  Demonstrates understanding / competency    Patient undertands incorporating physical activity into lifestyle.  Demonstrates understanding / competency    Patient understands using medications safely.  Demonstrates understanding / competency    Patient understands monitoring blood glucose, interpreting and using results  Demonstrates understanding / competency    Patient understands prevention, detection, and treatment of acute complications.  Demonstrates understanding / competency    Patient understands prevention, detection, and treatment of chronic  complications.  Demonstrates understanding / competency    Patient understands how to develop strategies to address psychosocial issues.  Demonstrates understanding / competency    Patient understands how to develop strategies to promote health/change behavior.  Demonstrates understanding / competency      Outcomes   Future DMSE  PRN    Program Status  Completed       Individualized Plan for Diabetes Self-Management Training:   Learning Objective:  Patient will have a greater understanding of diabetes self-management. Patient education plan is to attend individual and/or group sessions per assessed needs and concerns.   Plan:   Patient Instructions  Plan:  Aim for 4 Carb Choices per meal (60 grams) +/- 1 either way  Aim for 0-2 Carbs per snack if hungry  Include protein in moderation with your meals and snacks Consider reading food labels for Total Carbohydrate of foods Continue with your activity level by walking daily as tolerated Consider checking BG at alternate times per day as directed by MD  Consider taking medication as directed by MD We discussed insulin action of long acting and short acting insulins as well as cost of name brand insulin vs. [employer] ReliOn insulin options that are much less expensive at $24.99/ vial for Regular and NPH.  Expected Outcomes:      Education material provided: Living Well with Diabetes, A1C conversion sheet and Carbohydrate counting sheet, Insulin Action handout, Diabetes Medication handout  If problems or questions, patient to contact team via:  Phone  Future DSME appointment: PRN

## 2017-04-06 NOTE — Patient Instructions (Addendum)
Plan:  Aim for 4 Carb Choices per meal (60 grams) +/- 1 either way  Aim for 0-2 Carbs per snack if hungry  Include protein in moderation with your meals and snacks Consider reading food labels for Total Carbohydrate of foods Continue with your activity level by walking daily as tolerated Consider checking BG at alternate times per day as directed by MD  Consider taking medication as directed by MD We discussed insulin action of long acting and short acting insulins as well as cost of name brand insulin vs. [employer] ReliOn insulin options that are much less expensive at $24.99/ vial for Regular and NPH.

## 2017-04-21 ENCOUNTER — Ambulatory Visit: Payer: BLUE CROSS/BLUE SHIELD | Admitting: Endocrinology

## 2017-05-14 ENCOUNTER — Telehealth: Payer: Self-pay | Admitting: Nurse Practitioner

## 2017-05-14 MED ORDER — METFORMIN HCL 1000 MG PO TABS: 1000.0000 mg | ORAL_TABLET | Freq: Two times a day (BID) | ORAL | 0 refills | Status: DC

## 2017-05-14 NOTE — Telephone Encounter (Deleted)
Copied from [place] [id number]. Topic: Quick Communication - See Telephone Encounter >> May 14, 2017  8:32 AM Lolita Rieger, RMA wrote: CRM for notification. See Telephone encounter for:   05/14/17.pt was a [facility] pt and is out of his metfomin 1000 mg tab pt has appt scheduled for 09/01/17 to est care with [facility] but would like enough medication sent to [facility] on [address] in [place] to last him until his appointment please call pt @ [phone]

## 2017-05-14 NOTE — Telephone Encounter (Signed)
Copied from [place] [id number]. Topic: Quick Communication - See Telephone Encounter >> May 14, 2017  8:32 AM Lolita Rieger, RMA wrote: CRM for notification. See Telephone encounter for:   05/14/17.pt was a [facility] pt and is out of his metfomin 1000 mg tab pt has appt scheduled for 09/01/17 to est care with [facility] but would like enough medication sent to [facility] on [address] in [place] to last him until his appointment please call pt @ [phone]

## 2017-05-14 NOTE — Telephone Encounter (Signed)
Notified pt rx has been sent to [facility] 

## 2017-05-26 ENCOUNTER — Encounter: Payer: Self-pay | Admitting: Nurse Practitioner

## 2017-05-26 ENCOUNTER — Ambulatory Visit: Payer: BLUE CROSS/BLUE SHIELD | Admitting: Nurse Practitioner

## 2017-05-26 ENCOUNTER — Other Ambulatory Visit (INDEPENDENT_AMBULATORY_CARE_PROVIDER_SITE_OTHER): Payer: BLUE CROSS/BLUE SHIELD

## 2017-05-26 VITALS — BP 126/88 | HR 87 | Temp 98.1°F | Resp 16 | Ht 75.0 in | Wt 206.8 lb

## 2017-05-26 DIAGNOSIS — R0602 Shortness of breath: Secondary | ICD-10-CM

## 2017-05-26 DIAGNOSIS — G629 Polyneuropathy, unspecified: Secondary | ICD-10-CM | POA: Diagnosis not present

## 2017-05-26 DIAGNOSIS — Z794 Long term (current) use of insulin: Secondary | ICD-10-CM

## 2017-05-26 DIAGNOSIS — R3589 Other polyuria: Secondary | ICD-10-CM

## 2017-05-26 DIAGNOSIS — Z114 Encounter for screening for human immunodeficiency virus [HIV]: Secondary | ICD-10-CM

## 2017-05-26 DIAGNOSIS — E1142 Type 2 diabetes mellitus with diabetic polyneuropathy: Secondary | ICD-10-CM

## 2017-05-26 DIAGNOSIS — R358 Other polyuria: Secondary | ICD-10-CM

## 2017-05-26 DIAGNOSIS — M545 Low back pain, unspecified: Secondary | ICD-10-CM

## 2017-05-26 DIAGNOSIS — T148XXA Other injury of unspecified body region, initial encounter: Secondary | ICD-10-CM | POA: Diagnosis not present

## 2017-05-26 DIAGNOSIS — Z125 Encounter for screening for malignant neoplasm of prostate: Secondary | ICD-10-CM | POA: Diagnosis not present

## 2017-05-26 DIAGNOSIS — Z13220 Encounter for screening for lipoid disorders: Secondary | ICD-10-CM

## 2017-05-26 DIAGNOSIS — Z0001 Encounter for general adult medical examination with abnormal findings: Secondary | ICD-10-CM | POA: Insufficient documentation

## 2017-05-26 DIAGNOSIS — R4582 Worries: Secondary | ICD-10-CM

## 2017-05-26 DIAGNOSIS — L989 Disorder of the skin and subcutaneous tissue, unspecified: Secondary | ICD-10-CM

## 2017-05-26 LAB — TSH: TSH: 2.3 u[IU]/mL (ref 0.35–4.50)

## 2017-05-26 LAB — LIPID PANEL
CHOLESTEROL: 250 mg/dL — AB (ref 0–200)
HDL: 49.3 mg/dL (ref >39.00)
NonHDL: 200.99
Total CHOL/HDL Ratio: 5
Triglycerides: 237 mg/dL — ABNORMAL HIGH (ref 0.0–149.0)
VLDL: 47.4 mg/dL — AB (ref 0.0–40.0)

## 2017-05-26 LAB — URINALYSIS
BILIRUBIN URINE: NEGATIVE
HGB URINE DIPSTICK: NEGATIVE
Ketones, ur: NEGATIVE
Leukocytes, UA: NEGATIVE
NITRITE: NEGATIVE
Specific Gravity, Urine: 1.02 (ref 1.000–1.030)
TOTAL PROTEIN, URINE-UPE24: NEGATIVE
Urobilinogen, UA: 0.2 (ref 0.0–1.0)
pH: 5.5 (ref 5.0–8.0)

## 2017-05-26 LAB — HEMOGLOBIN A1C: Hgb A1c MFr Bld: 13.4 % — ABNORMAL HIGH (ref 4.6–6.5)

## 2017-05-26 LAB — COMPREHENSIVE METABOLIC PANEL
ALBUMIN: 4.1 g/dL (ref 3.5–5.2)
ALK PHOS: 92 U/L (ref 39–117)
ALT: 15 U/L (ref 0–53)
AST: 16 U/L (ref 0–37)
BUN: 18 mg/dL (ref 6–23)
CHLORIDE: 99 meq/L (ref 96–112)
CO2: 28 mEq/L (ref 19–32)
CREATININE: 0.96 mg/dL (ref 0.40–1.50)
Calcium: 9.6 mg/dL (ref 8.4–10.5)
GFR: 109.27 mL/min (ref >60.00)
Glucose, Bld: 389 mg/dL — ABNORMAL HIGH (ref 70–99)
POTASSIUM: 4.5 meq/L (ref 3.5–5.1)
Sodium: 133 mEq/L — ABNORMAL LOW (ref 135–145)
Total Bilirubin: 0.4 mg/dL (ref 0.2–1.2)
Total Protein: 7 g/dL (ref 6.0–8.3)

## 2017-05-26 LAB — PSA: PSA: 0.29 ng/mL (ref 0.10–4.00)

## 2017-05-26 LAB — LDL CHOLESTEROL, DIRECT: LDL DIRECT: 166 mg/dL

## 2017-05-26 NOTE — Assessment & Plan Note (Addendum)
-  Prostate cancer screening and PSA options (with potential risks and benefits of testing vs not testing) were discussed along with recent recs/guidelines. -USPSTF grade A and B recommendations reviewed with patient; age-appropriate recommendations, preventive care, screening tests, etc discussed and encouraged; healthy living encouraged; see AVS for patient education given to patient -Discussed importance of 150 minutes of physical activity weekly, eat 6 servings of fruit/vegetables daily and drink plenty of water and avoid sweet beverages.   -Reviewed Health Maintenance: ordered HIV screening, referral to ophthalmology for diabetic eye exam  - Lipid panel; Future-Screening for cholesterol level Not fasting ate cereal this am - PSA; Future-Screening for prostate cancer IPSS score 12- moderately symptomatic - HIV antibody; Future-Screening for HIV (human immunodeficiency virus)   Worries - TSH; Future  CBC 04/01/17 WNL Urine microalbumin 09/18/16 WNL

## 2017-05-26 NOTE — Assessment & Plan Note (Signed)
We had a detailed conversation about the long term risks of uncontrolled diabetes including eye, nerve, and kidney damage, heart attack, stroke. We again discussed referral to endocrinology due to patients inability to take many medications due to cost and/or side effects as well and likely needing insulin and close follow up to get his diabetes under control. We also talked about eating a healthy diet and avoiding heavy carbohydrate/sugar intake and the need for routine eye exams for diabetes. He says that he will be willing to follow up with endocrinology and ophthalmology at this point. RTC in 3 months for F/U - Comprehensive metabolic panel; Future - Hemoglobin A1c; Future - Ambulatory referral to Endocrinology - Ambulatory referral to Ophthalmology - Urinalysis; Future

## 2017-05-26 NOTE — Patient Instructions (Addendum)
Please head downstairs for lab work.  I have placed a referral to endocrinology for your diabetes and ophthalmology for your eye exam and dermatology for the spot on your leg. Our office will call you to schedule this appointment. You should hear from our office in 7-10 days. Please try to make your endocrinology appointment, we have to get your blood sugar down.  Id like to see you back in about 3 months, so I can see how you are doing and make sure you were able to get to endocrinology.   Shortness of Breath, Adult Shortness of breath means you have trouble breathing. Your lungs are organs for breathing. Follow these instructions at home: Pay attention to any changes in your symptoms. Take these actions to help with your condition:  Do not smoke. Smoking can cause shortness of breath. If you need help to quit smoking, ask your doctor.  Avoid things that can make it harder to breathe, such as: ? Mold. ? Dust. ? Air pollution. ? Chemical smells. ? Things that can cause allergy symptoms (allergens), if you have allergies.  Keep your living space clean and free of mold and dust.  Rest as needed. Slowly return to your usual activities.  Take over-the-counter and prescription medicines, including oxygen and inhaled medicines, only as told by your doctor.  Keep all follow-up visits as told by your doctor. This is important.  Contact a doctor if:  Your condition does not get better as soon as expected.  You have a hard time doing your normal activities, even after you rest.  You have new symptoms. Get help right away if:  You have trouble breathing when you are resting.  You feel light-headed or you faint.  You have a cough that is not helped by medicines.  You cough up blood.  You have pain with breathing.  You have pain in your chest, arms, shoulders, or belly (abdomen).  You have a fever.  You cannot walk up stairs.  You cannot exercise the way you normally  do. This information is not intended to replace advice given to you by your health care provider. Make sure you discuss any questions you have with your health care provider. Document Released: 08/19/2007 Document Revised: 03/19/2016 Document Reviewed: 03/19/2016 Elsevier Interactive Patient Education  2017 [employer].   Muscle Strain A muscle strain (pulled muscle) happens when a muscle is stretched beyond normal length. It happens when a sudden, violent force stretches your muscle too far. Usually, a few of the fibers in your muscle are torn. Muscle strain is common in athletes. Recovery usually takes 1-2 weeks. Complete healing takes 5-6 weeks. Follow these instructions at home:  Follow the PRICE method of treatment to help your injury get better. Do this the first 2-3 days after the injury: ? Protect. Protect the muscle to keep it from getting injured again. ? Rest. Limit your activity and rest the injured body part. ? Ice. Put ice in a plastic bag. Place a towel between your skin and the bag. Then, apply the ice and leave it on from 15-20 minutes each hour. After the third day, switch to moist heat packs. ? Compression. Use a splint or elastic bandage on the injured area for comfort. Do not put it on too tightly. ? Elevate. Keep the injured body part above the level of your heart.  Only take medicine as told by your doctor.  Warm up before doing exercise to prevent future muscle strains. Contact a doctor  if:  You have more pain or puffiness (swelling) in the injured area.  You feel numbness, tingling, or notice a loss of strength in the injured area. This information is not intended to replace advice given to you by your health care provider. Make sure you discuss any questions you have with your health care provider. Document Released: 12/10/2007 Document Revised: 08/08/2015 Document Reviewed: 09/29/2012 Elsevier Interactive Patient Education  2017 [employer].

## 2017-05-26 NOTE — Progress Notes (Signed)
Name: Barry Phillips   MRN: [id number]    DOB: 1970-10-28   Date:05/26/2017       Progress Note  Subjective  Chief Complaint  Chief Complaint  Patient presents with  . Establish Care    diabetes management, insulin, would like CPE, not fasting, referral to foot doctor    HPI Barry Phillips is establishing care with me today, transferring from another provider who left our clinic. Patient presents for annual CPE. We will also discuss his diabetes.  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  Depression:  Depression screen [facility] 2/9 04/02/2017 02/19/2017  Decreased Interest 0 0  Down, Depressed, Hopeless 0 0  PHQ - 2 Score 0 0    Hypertension: BP Readings from Last 3 Encounters:  05/26/17 126/88  04/01/17 119/80  02/19/17 (!) 142/84    Obesity: Wt Readings from Last 3 Encounters:  05/26/17 206 lb 12.8 oz (93.8 kg)  04/02/17 203 lb 9.6 oz (92.4 kg)  02/19/17 208 lb (94.3 kg)   BMI Readings from Last 3 Encounters:  05/26/17 25.85 kg/m  04/02/17 25.45 kg/m  02/19/17 26.00 kg/m   Lipids:  Lab Results  Component Value Date   CHOL 250 (H) 05/26/2017   Lab Results  Component Value Date   HDL 49.30 05/26/2017   No results found for: [facility] Lab Results  Component Value Date   TRIG 237.0 (H) 05/26/2017   Lab Results  Component Value Date   CHOLHDL 5 05/26/2017   Lab Results  Component Value Date   LDLDIRECT 166.0 05/26/2017   Glucose:  Glucose, Bld  Date Value Ref Range Status  05/26/2017 389 (H) 70 - 99 mg/dL Final  04/01/2017 562 (HH) 65 - 99 mg/dL Final    Comment:    CRITICAL RESULT CALLED TO, READ BACK BY AND VERIFIED WITH: A GUIJOZA,RN [id number] 04/01/2017 WBOND   09/08/2016 268 (H) 65 - 99 mg/dL  Final   Glucose-Capillary  Date Value Ref Range Status  04/01/2017 340 (H) 65 - 99 mg/dL Final  04/01/2017 464 (H) 65 - 99 mg/dL Final  07/13/2016 354 (H) 65 - 99 mg/dL Final   Alcohol: not a regular drinker- occasionally drinks a shot and a beer Tobacco use: occassionally smokes a cigarette- he is thinking of quitting but not ready  STD testing and prevention (chl/gon/syphilis): married in monogamous relationship; declines concerns for STDs today HIV: HIV screening ordered today  Skin cancer screening: lesion on left upper leg- has gotten larger then smaller over time, present for at least one year- sometimes the area feels painful  Colorectal cancer: no personal or family history of colon cancer, no abdominal pain, constipation or diarrhea, no rectal bleeding  Prostate cancer: PSA ordered today IPSS Questionnaire (AUA-7): Over the past month.   1)  How often have you had a sensation of not emptying your bladder completely after you finish urinating?  2 - Less than half the time  2)  How often have you had to urinate again less than two hours after you finished urinating? 3 - About half the time  3)  How often have you found you stopped and started again several times when you urinated?  3 - About half the time  4) How difficult have you found it to postpone urination?  3 - About half the time  5) How often have you had a weak urinary stream?  0 - Not at all  6) How often have you had to push or strain to begin urination?  0 - Not at all  7) How many times did you most typically get up to urinate from the time you went to bed until the time you got up in the morning?  1 - 1 time  Total score:  12          Aspirin: takes 81mg  daily ECG:  ekg ordered today  Vaccinations: declines influenza, pneumonia, TDAP vaccinations  Advanced Care Planning: A voluntary discussion about advance care planning including the explanation and discussion of advance directives.  Discussed health care  proxy and Living will, and the patient DOES NOT have a living will at present time. If patient does have living will, I have requested they bring this to the clinic to be scanned in to their chart.  Patient Active Problem List   Diagnosis Date Noted  . Encounter for general adult medical examination with abnormal findings 05/26/2017  . Flat foot 02/19/2017  . Polyneuropathy 02/19/2017  . Type 2 diabetes mellitus ([place]) 09/18/2016  . Benign skin mole 09/18/2016    Past Surgical History:  Procedure Laterality Date  . FRACTURE SURGERY    . SHOULDER SURGERY Right     Family History  Problem Relation Age of Onset  . Diabetes Father     Social History   Socioeconomic History  . Marital status: Married    Spouse name: Not on file  . Number of children: 3  . Years of education: GED  . Highest education level: Not on file  Social Needs  . Financial resource strain: Not on file  . Food insecurity - worry: Not on file  . Food insecurity - inability: Not on file  . Transportation needs - medical: Not on file  . Transportation needs - non-medical: Not on file  Occupational History  . Not on file  Tobacco Use  . Smoking status: Current Every Day Smoker    Packs/day: 1.00    Years: 19.00    Pack years: 19.00    Types: Cigarettes  . Smokeless tobacco: Never Used  Substance and Sexual Activity  . Alcohol use: Yes    Comment: Social  . Drug use: No  . Sexual activity: Not on file  Other Topics Concern  . Not on file  Social History Narrative   Fun/Hobby: Play with his daughters and family time.      Current Outpatient Medications:  .  aspirin 81 MG chewable tablet, Chew 81 mg by mouth daily., Disp: , Rfl:  .  gabapentin (NEURONTIN) 100 MG capsule, Take 1 capsule (100 mg total) by mouth 3 (three) times daily., Disp: 90 capsule, Rfl: 3 .  metFORMIN (GLUCOPHAGE) 1000 MG tablet, Take 1 tablet (1,000 mg total) by mouth 2 (two) times daily., Disp: 180 tablet, Rfl: 0  No Known  Allergies   ROS  Constitutional: Negative for fever or weight change.  Respiratory: Negative for cough. Positive for shortness of breath Cardiovascular: Negative for chest pain, palpitations, leg swelling. Gastrointestinal: Negative for abdominal pain, no bowel changes.  Genitourinary: Positive for polyuria. Negative for hematuria, dysuria. Musculoskeletal: Negative for gait problem or joint swelling. positive for back pain. Skin: Negative for rash.  Neurological: Negative for dizziness, headache, or falls. No other specific complaints in a complete review of systems (except as listed in  HPI above).  Back pain-  This is an acute problem. He has noticed pulling pain in his right lumbar-mid back over the past 2-3 weeks. He denies any radiation of pain. He says "the pain is so sharp it takes my breath away." He has not otherwise felt SOB aside from when he has the pain. 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 Has not tried anything for the pain.  Diabetes- maintained on metformin 1000 BID. He was on insulin at one point but said he could not afford to continue it due to financial constraints. He was also on steglatro but stopped due to side effects and has had trouble affording other medications due to cost. He admits he never checked to see if his health insurance would help him cover his medications. He does take the metformin daily and denies any adverse effects. Reports recent home glucose readings have been in the 300s  He was actually referred to endocrinology in December for continued elevated A1c and uncontrolled diabetes but did follow through with appointment. Denies  tremor, diaphoresis, polydipsia, polyphagia. He does experience frequent urination.  Lab Results  Component Value Date   HGBA1C 13.2 (H) 02/19/2017   Neuropathy- was started on gabapentin for neuropathy in December but he did not pick up the medication due to finances  and never checked to see if his insurance would cover the medication He describes his neuropathy as sharp burning pain in bilateral feet. He says the pain is constant. He denies weakness, falls, skin discoloration.  Objective  Vitals:   05/26/17 0811  BP: 126/88  Pulse: 87  Resp: 16  Temp: 98.1 F (36.7 C)  TempSrc: Oral  SpO2: 97%  Weight: 206 lb 12.8 oz (93.8 kg)  Height: 6\' 3"  (1.905 m)    Body mass index is 25.85 kg/m.  Physical Exam Vital signs reviewed Constitutional: Patient appears well-developed and well-nourished. No distress.  HENT: Head: Normocephalic and atraumatic. Ears: B TMs ok, no erythema or effusion; Nose: Nose normal. Mouth/Throat: Oropharynx is clear and moist. No oropharyngeal exudate.  Eyes: Conjunctivae and EOM are normal. Pupils are equal, round, and reactive to light. No scleral icterus.  Neck: Normal range of motion. Neck supple. No JVD present. No carotid bruits. Cardiovascular: Normal rate, regular rhythm and normal heart sounds. No BLE edema. Pulmonary/Chest: Effort normal and breath sounds normal. No respiratory distress. Abdominal: Soft. Bowel sounds are normal, no distension. There is no tenderness. no masses Musculoskeletal: Normal range of motion, no joint effusions. No gross deformities. Lower back without swelling or tenderness. Neurological: he is alert and oriented to person, place, and time. No cranial nerve deficit. Coordination, balance, strength, speech and gait are normal.  Skin: Skin is warm and dry. No rash noted. No erythema. Small, flat, hyperpigmented, firm, round lesion to left anterior thigh-uniform in color. Psychiatric: Patient has a normal mood and affect. behavior is normal. Judgment and thought content normal.  Assessment & Plan RTC in 3 months for follow up on routine healthcare needs, neuropathy- evaluate response to neurontin therapy, ensure endocrinology follow up  He expresses financial difficulty during office visit  today but does have insurance and admits he has not checked insurance benefits for appointments or medications-we talked about reviewing his insurance policy today and using [facility] for medication coupons, as well as the need for continued close follow up until we are able to get better control of his chronic medical condition   Polyuria  Suspect related to uncontrolled diabetes but will check PSA, UA today - Urinalysis; Future  SOB (shortness of breath) This sounds related to his back pain, no other red flags. EKG today. Encouraged to stop smoking. We discussed return precautions including when to seek emergency care-See AVS for education provided to patient - EKG 12-Lead I have personally reviewed the EKG tracing which reads sinus rhythm, no acute abnormalities, no old EKG available for comparison Return precautions including calling 911 for chest pain or shortness of breath were discussed. ekg was reviewed with Dr Sharlet Salina  Skin problem ?dermatofibroma but can not be sure Encouraged referral to dermatology for further evaluation and management and for routine skin check and he is agreeable - Ambulatory referral to Dermatology  Acute right-sided low back pain without sciatica ?muscle strain  Discussed return precautions, care of muscle strain at home and he will follow up for worsening/no improvement of pain -See AVS for education provided to patient

## 2017-05-26 NOTE — Assessment & Plan Note (Signed)
We discussed starting neurontin to see if this would improve his symptoms- recommended trying [facility] to improve affordability of medications and he was given a prescription discount card We also talked about the need for diabetes control to improve neuropathy RTC in 3 months for F/U

## 2017-05-27 LAB — HIV ANTIBODY (ROUTINE TESTING W REFLEX): HIV: NONREACTIVE

## 2017-05-29 ENCOUNTER — Other Ambulatory Visit: Payer: Self-pay | Admitting: Family

## 2017-05-29 MED ORDER — INSULIN GLARGINE 100 UNIT/ML SOLOSTAR PEN: 10.0000 [IU] | PEN_INJECTOR | Freq: Every day | SUBCUTANEOUS | 1 refills | Status: DC

## 2017-05-29 MED ORDER — INSULIN PEN NEEDLE 32G X 6 MM MISC: 0 refills | Status: DC

## 2017-05-29 MED ORDER — ROSUVASTATIN CALCIUM 10 MG PO TABS: 10.0000 mg | ORAL_TABLET | Freq: Every day | ORAL | 0 refills | Status: DC

## 2017-06-08 ENCOUNTER — Other Ambulatory Visit: Payer: Self-pay | Admitting: Nurse Practitioner

## 2017-06-08 MED ORDER — INSULIN SYRINGES (DISPOSABLE) U-100 1 ML MISC: 0 refills | Status: DC

## 2017-06-08 MED ORDER — INSULIN NPH ISOPHANE & REGULAR (70-30) 100 UNIT/ML ~~LOC~~ SUSP: 15.0000 [IU] | Freq: Two times a day (BID) | SUBCUTANEOUS | 11 refills | Status: DC

## 2017-06-08 NOTE — Progress Notes (Signed)
orders

## 2017-07-07 ENCOUNTER — Ambulatory Visit: Payer: BLUE CROSS/BLUE SHIELD | Admitting: Endocrinology

## 2017-07-07 ENCOUNTER — Encounter: Payer: Self-pay | Admitting: Endocrinology

## 2017-07-07 VITALS — BP 124/82 | HR 81 | Ht 75.0 in | Wt 208.0 lb

## 2017-07-07 DIAGNOSIS — E1065 Type 1 diabetes mellitus with hyperglycemia: Secondary | ICD-10-CM | POA: Diagnosis not present

## 2017-07-07 MED ORDER — INSULIN ASPART 100 UNIT/ML FLEXPEN: PEN_INJECTOR | SUBCUTANEOUS | 1 refills | Status: DC

## 2017-07-07 NOTE — Progress Notes (Signed)
Patient ID: Barry Phillips, male   DOB: 05-May-1970, 47 y.o.   MRN: [id number]           Reason for Appointment: Consultation for Type 2 Diabetes  Referring PCP: Brien Few   History of Present Illness:          Date of diagnosis of type 2 diabetes mellitus: 2008       Background history:  He thinks his blood sugar at the time of diagnosis was 1000 and he was started on insulin Also at that time he was probably weighing about 260 pounds Also had been on metformin since diagnosis He was treated elsewhere and no records are available He thinks that his insulin regimen has only been with Lantus and he has never taken mealtime insulin In the last few years he has had difficulty with affording insulin and has not had any good control of his diabetes  Recent history:   INSULIN regimen is: Lantus 10 units at 10 PM   Most recent A1c is 13.4  Non-insulin hypoglycemic drugs the patient is taking are: Metformin 1 g twice daily  Current management, blood sugar patterns and problems identified:  Over the last few years he has had difficulty getting his insulin and this is mostly related to financial or insurance issues  He has been continued on metformin since the time of diagnosis  He is also relatively new to town last year and only recently started working with his PCP for diabetes management  He was started on 10 units of insulin in March but the dose has not been adjusted, he thinks blood sugars before starting insulin were as high as 500 and he was also symptomatic with increased thirst and urination at that time  Currently does not think he has had much frequency of urination but occasionally will have blurred vision  He has never been on any mealtime insulin   He has been seen by the dietitian in January and generally tries to watch portions and high-fat foods  He is working night shifts currently  With his work he is physically active        Side effects from  medications have been:  Compliance with the medical regimen: Fair Hypoglycemia:   Never  Glucose monitoring:  done 1-2 times a day         Glucometer: Relion       Blood Glucose readings by recall  PREMEAL am Lunch Dinner 10 pm  Overall   Glucose range: 190-200   170-200   Median:         Self-care:sThe diet that the patient has been following is: tries to limit .     Meal times are:  Breakfast is at 5 PM, also has a meal at 6-7 AM dinner: 8 PM  Typical meal intake: Breakfast is usually toast, eggs and oatmeal               Dietician visit, most recent:1/19               Exercise:  Physical activity at work  Weight history: 260  Wt Readings from Last 3 Encounters:  07/07/17 208 lb (94.3 kg)  05/26/17 206 lb 12.8 oz (93.8 kg)  04/02/17 203 lb 9.6 oz (92.4 kg)    Glycemic control:   Lab Results  Component Value Date   HGBA1C 13.4 (H) 05/26/2017   HGBA1C 13.2 (H) 02/19/2017   HGBA1C 14.0 (H) 09/18/2016   Lab Results  Component Value  Date   MICROALBUR 0.7 09/18/2016   CREATININE 0.96 05/26/2017   Lab Results  Component Value Date   MICRALBCREAT 1.0 09/18/2016    No results found for: FRUCTOSAMINE    Allergies as of 07/07/2017   No Known Allergies     Medication List        Accurate as of 07/07/17  1:54 PM. Always use your most recent med list.          aspirin 81 MG chewable tablet Chew 81 mg by mouth daily.   gabapentin 100 MG capsule Commonly known as:  NEURONTIN Take 1 capsule (100 mg total) by mouth 3 (three) times daily.   insulin aspart 100 UNIT/ML FlexPen Commonly known as:  NOVOLOG FLEXPEN 10 units ac tid   insulin glargine 100 UNIT/ML injection Commonly known as:  LANTUS Inject 10 Units into the skin at bedtime.   Insulin Pen Needle 32G X 6 MM Misc Use daily as directed to inject insulin   Insulin Syringes (Disposable) U-100 1 ML Misc As directed   metFORMIN 1000 MG tablet Commonly known as:  GLUCOPHAGE Take 1 tablet (1,000 mg  total) by mouth 2 (two) times daily.   rosuvastatin 10 MG tablet Commonly known as:  CRESTOR Take 1 tablet (10 mg total) by mouth daily.       Allergies: No Known Allergies  Past Medical History:  Diagnosis Date  . Diabetes mellitus without complication [facility])     Past Surgical History:  Procedure Laterality Date  . FRACTURE SURGERY    . SHOULDER SURGERY Right     Family History  Problem Relation Age of Onset  . Diabetes Father     Social History:  reports that he has been smoking cigarettes.  He has a 19.00 pack-year smoking history. He has never used smokeless tobacco. He reports that he drinks alcohol. He reports that he does not use drugs.   Review of Systems  Constitutional: Negative for weight gain.  HENT: Negative for trouble swallowing.   Eyes: Positive for blurred vision.  Respiratory: Negative for shortness of breath.   Cardiovascular: Negative for chest pain.  Gastrointestinal: Positive for abdominal pain. Negative for diarrhea.  Endocrine: Positive for erectile dysfunction. Negative for fatigue.       Has intermittent erectile dysfunction  Genitourinary: Negative for frequency and nocturia.  Musculoskeletal: Negative for joint pain.  Skin: Negative for rash.  Neurological: Positive for numbness.       He has some numbness in his feet  Psychiatric/Behavioral: Negative for insomnia.     Lipid history: Recently started on Crestor for hyperlipidemia    Lab Results  Component Value Date   CHOL 250 (H) 05/26/2017   HDL 49.30 05/26/2017   LDLDIRECT 166.0 05/26/2017   TRIG 237.0 (H) 05/26/2017   CHOLHDL 5 05/26/2017           Hypertension: Not present  BP Readings from Last 3 Encounters:  07/07/17 124/82  05/26/17 126/88  04/01/17 119/80    Most recent eye exam was in 2014  Most recent foot exam:    LABS:  No visits with results within 1 Week(s) from this visit.  Latest known visit with results is:  Appointment on 05/26/2017  Component  Date Value Ref Range Status  . HIV 1&2 Ab, 4th Generation 05/26/2017 NON-REACTIVE  NON-REACTI Final   Comment: HIV-1 antigen and HIV-1/HIV-2 antibodies were not detected. There is no laboratory evidence of HIV infection. Marland Kitchen PLEASE NOTE: This information has been disclosed to  you from records whose confidentiality may be protected by state law.  If your state requires such protection, then the state law prohibits you from making any further disclosure of the information without the specific written consent of the person to whom it pertains, or as otherwise permitted by law. A general authorization for the release of medical or other information is NOT sufficient for this purpose. . For additional information please refer to http://education.[facility].com/faq/FAQ106 (This link is being provided for informational/ educational purposes only.) . Marland Kitchen The performance of this assay has not been clinically validated in patients less than 78 years old. .   . Hgb A1c MFr Bld 05/26/2017 13.4* 4.6 - 6.5 % Final   Glycemic Control Guidelines for People with Diabetes:Non Diabetic:  <6%Goal of Therapy: <7%Additional Action Suggested:  >8%   . TSH 05/26/2017 2.30  0.35 - 4.50 uIU/mL Final  . PSA 05/26/2017 0.29  0.10 - 4.00 ng/mL Final   Test performed using Access Hybr[employer]ch PSA Assay, a parmagnetic partical, chemiluminecent immunoassay.  . Cholesterol 05/26/2017 250* 0 - 200 mg/dL Final   ATP III Classification       Desirable:  < 200 mg/dL               Borderline High:  200 - 239 mg/dL          High:  > = 240 mg/dL  . Triglycerides 05/26/2017 237.0* 0.0 - 149.0 mg/dL Final   Normal:  <150 mg/dLBorderline High:  150 - 199 mg/dL  . HDL 05/26/2017 49.30  >39.00 mg/dL Final  . VLDL 05/26/2017 47.4* 0.0 - 40.0 mg/dL Final  . Total CHOL/HDL Ratio 05/26/2017 5   Final                  Men          Women1/2 Average Risk     3.4          3.3Average Risk          5.0          4.42X Average Risk           9.6          7.13X Average Risk          15.0          11.0                      . NonHDL 05/26/2017 200.99   Final   NOTE:  Non-HDL goal should be 30 mg/dL higher than patient's LDL goal (i.e. LDL goal of < 70 mg/dL, would have non-HDL goal of < 100 mg/dL)  . Sodium 05/26/2017 133* 135 - 145 mEq/L Final  . Potassium 05/26/2017 4.5  3.5 - 5.1 mEq/L Final  . Chloride 05/26/2017 99  96 - 112 mEq/L Final  . CO2 05/26/2017 28  19 - 32 mEq/L Final  . Glucose, Bld 05/26/2017 389* 70 - 99 mg/dL Final  . BUN 05/26/2017 18  6 - 23 mg/dL Final  . Creatinine, Ser 05/26/2017 0.96  0.40 - 1.50 mg/dL Final  . Total Bilirubin 05/26/2017 0.4  0.2 - 1.2 mg/dL Final  . Alkaline Phosphatase 05/26/2017 92  39 - 117 U/L Final  . AST 05/26/2017 16  0 - 37 U/L Final  . ALT 05/26/2017 15  0 - 53 U/L Final  . Total Protein 05/26/2017 7.0  6.0 - 8.3 g/dL Final  . Albumin 05/26/2017 4.1  3.5 -  5.2 g/dL Final  . Calcium 05/26/2017 9.6  8.4 - 10.5 mg/dL Final  . GFR 05/26/2017 109.27  >60.00 mL/min Final  . Color, Urine 05/26/2017 YELLOW  Yellow;Lt. Yellow Final  . APPearance 05/26/2017 CLEAR  Clear Final  . Specific Gravity, Urine 05/26/2017 1.020  1.000 - 1.030 Final  . pH 05/26/2017 5.5  5.0 - 8.0 Final  . Total Protein, Urine 05/26/2017 NEGATIVE  Negative Final  . Urine Glucose 05/26/2017 >=1000* Negative Final  . Ketones, ur 05/26/2017 NEGATIVE  Negative Final  . Bilirubin Urine 05/26/2017 NEGATIVE  Negative Final  . Hgb urine dipstick 05/26/2017 NEGATIVE  Negative Final  . Urobilinogen, UA 05/26/2017 0.2  0.0 - 1.0 Final  . Leukocytes, UA 05/26/2017 NEGATIVE  Negative Final  . Nitrite 05/26/2017 NEGATIVE  Negative Final  . Direct LDL 05/26/2017 166.0  mg/dL Final   Optimal:  <100 mg/dLNear or Above Optimal:  100-129 mg/dLBorderline High:  130-159 mg/dLHigh:  160-189 mg/dLVery High:  >190 mg/dL    Physical Examination:  BP 124/82 (BP Location: Right Arm, Patient Position: Sitting, Cuff Size: Normal)    Pulse 81   Ht 6\' 3"  (1.905 m)   Wt 208 lb (94.3 kg)   SpO2 98%   BMI 26.00 kg/m   GENERAL:      He is well-built and nourished, on no abdominal obesity     HEENT:         Eye exam shows normal external appearance.  Fundus exam shows no retinopathy.  Oral exam shows normal mucosa .  NECK:   There is no lymphadenopathy Thyroid is not enlarged and no nodules felt.  Carotids are normal to palpation and no bruit heard  LUNGS:         Chest is symmetrical. Lungs are clear to auscultation.Marland Kitchen   HEART:         Heart sounds:  S1 and S2 are normal. No murmur or click heard., no S3 or S4.   ABDOMEN:   There is no distention present. Liver and spleen are not palpable. No other mass or tenderness present.    NEUROLOGICAL:   Ankle jerks are absent bilaterally.    Diabetic Foot Exam - Simple   Simple Foot Form Diabetic Foot exam was performed with the following findings:  Yes   Visual Inspection No deformities, no ulcerations, no other skin breakdown bilaterally:  Yes Sensation Testing Intact to touch and monofilament testing bilaterally:  Yes Pulse Check Posterior Tibialis and Dorsalis pulse intact bilaterally:  Yes Comments            Vibration sense is moderately reduced in distal first toes. MUSCULOSKELETAL:  There is no swelling or deformity of the peripheral joints.     EXTREMITIES:     There is no edema.  SKIN:       No rash or lesions of concern.        ASSESSMENT:  Diabetes insulin-dependent, persistently uncontrolled  See history of present illness for detailed discussion of current diabetes management, blood sugar patterns and problems identified     His A1c has been consistently around 13-14 since last year  Although not clear if he had ketoacidosis at the time of diagnosis he is clearly insulin-dependent and not obese Even with taking basal insulin and metformin his blood sugar today in the office is 274 several hours after his meal last night His diet is generally  fairly good and he is physically active He has had only basic diabetes education and needs  much more   Complications of diabetes: Neuropathy with mild symptoms and minimal signs, erectile dysfunction Will need eye exam to evaluate for retinopathy Also will check urine microalbumin once his blood sugars are better controlled  HYPERLIPIDEMIA: He will be following up with his PCP since recently starting Crestor  PLAN:    Today discussed in detail the need for mealtime insulin to cover postprandial spikes, action of mealtime insulin, use of the insulin pen, timing and action of the rapid acting insulin as well as starting dose and dosage titration to target the two-hour reading of under 180  For now he will start with 6 units for lunch and 8 units for breakfast and dinner with relatively larger doses for larger meals  He will need to start checking his blood sugars at least 2 or 3 times a day including some readings 2 hours after meals  Discussed in detail the action of basal and bolus insulins and need to have combination therapy to control blood sugars round-the-clock consistently  He will also need to check with his insurance which brand of meter they will cover instead of the [employer] brand  Titration sheet given for adjusting Lantus which she will continue take in the evening  Meanwhile he will go up to 14 units of the Lantus and adjust every 3 days x 2 units in the morning sugars are under 130  Given general information on hypoglycemia also  He will follow-up next week with diabetes educator since he may need further education and help with day-to-day management including insulin  Since he is not insulin resistant he can probably stop the metformin after he finishes this  Patient Instructions  Novolog before meals  Check blood sugars on waking up  daily  Also check blood sugars about 2 hours after a meal and do this after different meals by rotation  Recommended blood sugar  levels on waking up is 90-130 and about 2 hours after meal is 130-160  Please bring your blood sugar monitor to each visit, thank you  Check brand of meter covered   Counseling time on subjects discussed in assessment and plan sections is over 50% of today's 60 minute visit   Consultation note has been sent to the referring physician  Elayne Snare 07/07/2017, 1:54 PM   Note: This office note was prepared with Dragon voice recognition system technology. Any transcriptional errors that result from this process are unintentional.

## 2017-07-07 NOTE — Patient Instructions (Addendum)
Novolog before meals  Check blood sugars on waking up  daily  Also check blood sugars about 2 hours after a meal and do this after different meals by rotation  Recommended blood sugar levels on waking up is 90-130 and about 2 hours after meal is 130-160  Please bring your blood sugar monitor to each visit, thank you  Check brand of meter covered

## 2017-07-13 ENCOUNTER — Encounter: Payer: BLUE CROSS/BLUE SHIELD | Admitting: Nutrition

## 2017-07-26 ENCOUNTER — Ambulatory Visit: Payer: BLUE CROSS/BLUE SHIELD | Admitting: Endocrinology

## 2017-07-29 ENCOUNTER — Ambulatory Visit (INDEPENDENT_AMBULATORY_CARE_PROVIDER_SITE_OTHER): Payer: BLUE CROSS/BLUE SHIELD | Admitting: Endocrinology

## 2017-07-29 ENCOUNTER — Encounter: Payer: Self-pay | Admitting: Endocrinology

## 2017-07-29 VITALS — BP 130/88 | HR 88 | Ht 75.0 in | Wt 209.0 lb

## 2017-07-29 DIAGNOSIS — E1065 Type 1 diabetes mellitus with hyperglycemia: Secondary | ICD-10-CM | POA: Diagnosis not present

## 2017-07-29 LAB — POCT GLYCOSYLATED HEMOGLOBIN (HGB A1C): Hemoglobin A1C: 11.5

## 2017-07-29 NOTE — Patient Instructions (Addendum)
Check blood sugars on waking up    Also check blood sugars about 2 hours after a meal and do this after different meals by rotation  Recommended blood sugar levels on waking up is 90-130 and about 2 hours after meal is 130-180  Please bring your blood sugar monitor to each visit, thank you  Novolog 6 units before Bfst and and lunch 8 at supper  After 1 week of Novolog may go up on Lantus

## 2017-07-29 NOTE — Progress Notes (Signed)
Patient ID: Barry Phillips, male   DOB: 09-24-1970, 47 y.o.   MRN: [id number]           Reason for Appointment: Consultation for Type 2 Diabetes  Referring PCP: Brien Few   History of Present Illness:          Date of diagnosis of type 2 diabetes mellitus: 2008       Background history:  He thinks his blood sugar at the time of diagnosis was 1000 and he was started on insulin Also at that time he was probably weighing about 260 pounds Also had been on metformin since diagnosis He was treated elsewhere and no records are available He thinks that his insulin regimen has only been with Lantus and he has never taken mealtime insulin In the last few years he has had difficulty with affording insulin and has not had any good control of his diabetes  Recent history:   INSULIN regimen is: Lantus 16 units at 10 PM   Most recent A1c is 11.5, previously 13.4  Non-insulin hypoglycemic drugs the patient is taking are: Metformin 1 g twice daily  Current management, blood sugar patterns and problems identified:  On his initial consultation he was supposed to start NovoLog with each meal but he has not filled his prescription as yet  Also did not keep his appointment with the nurse educator a couple weeks ago as directed  Over the last month his blood sugars have been consistently high and he increased his Lantus on his own by 6 units  He has been seen by the dietitian in January and generally tries to watch portions and high-fat foods  He is working day shifts now  With his work he is physically active but blood sugars do not appear to be any lower in the afternoons than in the mornings  He was asked to find out what his insurance covers as far as glucose monitor brands and he has not done so  Still checking his blood sugars very sporadically with only 6 readings in the last 2 weeks        Side effects from medications have been:  Compliance with the medical regimen:  Fair Hypoglycemia:   Never  Glucose monitoring:  done 1-2 times a day         Glucometer: Relion       Blood Glucose readings by review of meter:  AVERAGE 240 with a range 186-343 Fasting blood sugar range 186-343 and afternoon/evening range 158-248  Self-care:sThe diet that the patient has been following is: None   Meal times are:  Breakfast is at 7, also has a meal at 12, dinner: 8 PM  Typical meal intake: Breakfast is usually toast, eggs and oatmeal               Dietician visit, most recent:1/19               Exercise:  Physical activity at work  Weight history: 260  Wt Readings from Last 3 Encounters:  07/29/17 209 lb (94.8 kg)  07/07/17 208 lb (94.3 kg)  05/26/17 206 lb 12.8 oz (93.8 kg)    Glycemic control:   Lab Results  Component Value Date   HGBA1C 11.5 07/29/2017   HGBA1C 13.4 (H) 05/26/2017   HGBA1C 13.2 (H) 02/19/2017   Lab Results  Component Value Date   MICROALBUR 0.7 09/18/2016   CREATININE 0.96 05/26/2017   Lab Results  Component Value Date   MICRALBCREAT 1.0 09/18/2016  No results found for: FRUCTOSAMINE    Allergies as of 07/29/2017   No Known Allergies     Medication List        Accurate as of 07/29/17  4:50 PM. Always use your most recent med list.          aspirin 81 MG chewable tablet Chew 81 mg by mouth daily.   gabapentin 100 MG capsule Commonly known as:  NEURONTIN Take 1 capsule (100 mg total) by mouth 3 (three) times daily.   insulin aspart 100 UNIT/ML FlexPen Commonly known as:  NOVOLOG FLEXPEN 10 units ac tid   insulin glargine 100 UNIT/ML injection Commonly known as:  LANTUS Inject 16 Units into the skin at bedtime.   Insulin Pen Needle 32G X 6 MM Misc Use daily as directed to inject insulin   Insulin Syringes (Disposable) U-100 1 ML Misc As directed   metFORMIN 1000 MG tablet Commonly known as:  GLUCOPHAGE Take 1 tablet (1,000 mg total) by mouth 2 (two) times daily.   rosuvastatin 10 MG  tablet Commonly known as:  CRESTOR Take 1 tablet (10 mg total) by mouth daily.       Allergies: No Known Allergies  Past Medical History:  Diagnosis Date  . Diabetes mellitus without complication [facility])     Past Surgical History:  Procedure Laterality Date  . FRACTURE SURGERY    . SHOULDER SURGERY Right     Family History  Problem Relation Age of Onset  . Diabetes Father     Social History:  reports that he has been smoking cigarettes.  He has a 19.00 pack-year smoking history. He has never used smokeless tobacco. He reports that he drinks alcohol. He reports that he does not use drugs.   Review of Systems     Lipid history: Recently started on Crestor for hyperlipidemia by PCP    Lab Results  Component Value Date   CHOL 250 (H) 05/26/2017   HDL 49.30 05/26/2017   LDLDIRECT 166.0 05/26/2017   TRIG 237.0 (H) 05/26/2017   CHOLHDL 5 05/26/2017           Hypertension: Not present  BP Readings from Last 3 Encounters:  07/29/17 130/88  07/07/17 124/82  05/26/17 126/88    Most recent eye exam was in 2014  Most recent foot exam:    LABS:  Office Visit on 07/29/2017  Component Date Value Ref Range Status  . Hemoglobin A1C 07/29/2017 11.5   Final    Physical Examination:  BP 130/88 (BP Location: Left Arm, Patient Position: Sitting, Cuff Size: Normal)   Pulse 88   Ht 6\' 3"  (1.905 m)   Wt 209 lb (94.8 kg)   SpO2 97%   BMI 26.12 kg/m    ASSESSMENT:  Diabetes insulin-dependent, uncontrolled  See history of present illness for detailed discussion of current diabetes management, blood sugar patterns and problems identified     His A1c has been consistently over 13 but now appears to be improving  Although he is only taking basal insulin and metformin his blood sugars are still well over 200 consistently He did not start his mealtime insulin, has not picked it up from the pharmacy and is planning to do so tomorrow Discussed need for controlling  postprandial reading Also need better blood sugar monitoring with readings after meals and also more consistently fasting to help adjust his insulin  He is currently using a [employer] brand meter which has incorrect time programmed and difficult to interpret his  download   HYPERLIPIDEMIA: He will be following up with his PCP since he started Crestor  PLAN:    Start NovoLog with at least 6 units before breakfast and lunch and 8 at dinnertime  May need to increase Lantus after a week of this if morning sugars are still over at least 140  Needs consultation with dietitian and nurse educator  He will find out what brand of meter is covered by his insurance and call for prescription  Patient Instructions  Check blood sugars on waking up    Also check blood sugars about 2 hours after a meal and do this after different meals by rotation  Recommended blood sugar levels on waking up is 90-130 and about 2 hours after meal is 130-180  Please bring your blood sugar monitor to each visit, thank you  Novolog 6 units before Bfst and and lunch 8 at supper  After 1 week of Novolog may go up on Lantus       Elayne Snare 07/29/2017, 4:50 PM   Note: This office note was prepared with Dragon voice recognition system technology. Any transcriptional errors that result from this process are unintentional.

## 2017-09-01 ENCOUNTER — Ambulatory Visit: Payer: BLUE CROSS/BLUE SHIELD | Admitting: Nurse Practitioner

## 2017-09-21 ENCOUNTER — Other Ambulatory Visit: Payer: BLUE CROSS/BLUE SHIELD

## 2017-09-27 ENCOUNTER — Ambulatory Visit: Payer: BLUE CROSS/BLUE SHIELD | Admitting: Endocrinology

## 2017-10-25 ENCOUNTER — Other Ambulatory Visit: Payer: Self-pay | Admitting: Family

## 2017-11-17 NOTE — Progress Notes (Signed)
This encounter was created in error - please disregard.

## 2017-11-18 ENCOUNTER — Other Ambulatory Visit: Payer: Self-pay | Admitting: *Deleted

## 2017-11-18 ENCOUNTER — Encounter: Payer: Self-pay | Admitting: Endocrinology

## 2017-11-18 ENCOUNTER — Encounter: Payer: BLUE CROSS/BLUE SHIELD | Admitting: Endocrinology

## 2017-11-18 ENCOUNTER — Other Ambulatory Visit (INDEPENDENT_AMBULATORY_CARE_PROVIDER_SITE_OTHER): Payer: BLUE CROSS/BLUE SHIELD

## 2017-11-18 VITALS — BP 140/110 | HR 69 | Ht 75.0 in | Wt 195.0 lb

## 2017-11-18 DIAGNOSIS — Z794 Long term (current) use of insulin: Secondary | ICD-10-CM

## 2017-11-18 DIAGNOSIS — E1065 Type 1 diabetes mellitus with hyperglycemia: Secondary | ICD-10-CM | POA: Diagnosis not present

## 2017-11-18 DIAGNOSIS — E1142 Type 2 diabetes mellitus with diabetic polyneuropathy: Secondary | ICD-10-CM | POA: Diagnosis not present

## 2017-11-18 LAB — BASIC METABOLIC PANEL
BUN: 15 mg/dL (ref 6–23)
CHLORIDE: 103 meq/L (ref 96–112)
CO2: 29 meq/L (ref 19–32)
Calcium: 9.4 mg/dL (ref 8.4–10.5)
Creatinine, Ser: 0.89 mg/dL (ref 0.40–1.50)
GFR: 118.99 mL/min (ref >60.00)
GLUCOSE: 318 mg/dL — AB (ref 70–99)
POTASSIUM: 4.3 meq/L (ref 3.5–5.1)
SODIUM: 137 meq/L (ref 135–145)

## 2017-11-18 LAB — HEMOGLOBIN A1C: Hgb A1c MFr Bld: 11.5 % — ABNORMAL HIGH (ref 4.6–6.5)

## 2017-11-18 LAB — MICROALBUMIN / CREATININE URINE RATIO
Creatinine,U: 94.6 mg/dL
MICROALB/CREAT RATIO: 1.7 mg/g (ref 0.0–30.0)
Microalb, Ur: 1.6 mg/dL (ref 0.0–1.9)

## 2017-11-18 MED ORDER — BD LANCET ULTRAFINE 33G MISC: 3 refills | Status: DC

## 2017-11-18 MED ORDER — GLUCOSE BLOOD VI STRP: ORAL_STRIP | 12 refills | Status: DC

## 2017-11-19 LAB — FRUCTOSAMINE: Fructosamine: 495 umol/L — ABNORMAL HIGH (ref 0–285)

## 2017-11-23 ENCOUNTER — Other Ambulatory Visit: Payer: Self-pay | Admitting: Endocrinology

## 2017-11-23 MED ORDER — BD LANCET ULTRAFINE 30G MISC: 3 refills | Status: DC

## 2017-11-23 NOTE — Telephone Encounter (Signed)
Refilled BD 30g lancet and sent to [facility]

## 2017-12-10 ENCOUNTER — Ambulatory Visit: Payer: BLUE CROSS/BLUE SHIELD | Admitting: Endocrinology

## 2017-12-10 DIAGNOSIS — Z0289 Encounter for other administrative examinations: Secondary | ICD-10-CM

## 2018-04-17 ENCOUNTER — Other Ambulatory Visit: Payer: Self-pay | Admitting: Endocrinology

## 2018-04-17 DIAGNOSIS — Z794 Long term (current) use of insulin: Principal | ICD-10-CM

## 2018-04-17 DIAGNOSIS — E1142 Type 2 diabetes mellitus with diabetic polyneuropathy: Secondary | ICD-10-CM

## 2018-04-17 DIAGNOSIS — E782 Mixed hyperlipidemia: Secondary | ICD-10-CM

## 2018-04-18 ENCOUNTER — Other Ambulatory Visit: Payer: BLUE CROSS/BLUE SHIELD

## 2018-04-21 ENCOUNTER — Ambulatory Visit: Payer: BLUE CROSS/BLUE SHIELD | Admitting: Endocrinology

## 2018-04-22 ENCOUNTER — Encounter (HOSPITAL_COMMUNITY): Payer: Self-pay | Admitting: *Deleted

## 2018-04-22 ENCOUNTER — Other Ambulatory Visit: Payer: Self-pay

## 2018-04-22 ENCOUNTER — Emergency Department (HOSPITAL_COMMUNITY)
Admission: EM | Admit: 2018-04-22 | Discharge: 2018-04-22 | Disposition: A | Discharge status: Home / Self Care | Payer: BLUE CROSS/BLUE SHIELD | Attending: Emergency Medicine | Admitting: Emergency Medicine

## 2018-04-22 ENCOUNTER — Telehealth: Payer: Self-pay | Admitting: Endocrinology

## 2018-04-22 DIAGNOSIS — E1140 Type 2 diabetes mellitus with diabetic neuropathy, unspecified: Secondary | ICD-10-CM | POA: Diagnosis not present

## 2018-04-22 DIAGNOSIS — E1165 Type 2 diabetes mellitus with hyperglycemia: Secondary | ICD-10-CM | POA: Diagnosis not present

## 2018-04-22 DIAGNOSIS — R200 Anesthesia of skin: Secondary | ICD-10-CM | POA: Diagnosis not present

## 2018-04-22 DIAGNOSIS — R531 Weakness: Secondary | ICD-10-CM | POA: Diagnosis not present

## 2018-04-22 DIAGNOSIS — R739 Hyperglycemia, unspecified: Secondary | ICD-10-CM

## 2018-04-22 DIAGNOSIS — F17210 Nicotine dependence, cigarettes, uncomplicated: Secondary | ICD-10-CM | POA: Insufficient documentation

## 2018-04-22 DIAGNOSIS — Z794 Long term (current) use of insulin: Secondary | ICD-10-CM | POA: Diagnosis not present

## 2018-04-22 DIAGNOSIS — G629 Polyneuropathy, unspecified: Secondary | ICD-10-CM

## 2018-04-22 DIAGNOSIS — I10 Essential (primary) hypertension: Secondary | ICD-10-CM | POA: Insufficient documentation

## 2018-04-22 DIAGNOSIS — Z79899 Other long term (current) drug therapy: Secondary | ICD-10-CM | POA: Diagnosis not present

## 2018-04-22 HISTORY — DX: Essential (primary) hypertension: I10

## 2018-04-22 LAB — CBC
HCT: 43 % (ref 39.0–52.0)
Hemoglobin: 14.2 g/dL (ref 13.0–17.0)
MCH: 28.6 pg (ref 26.0–34.0)
MCHC: 33 g/dL (ref 30.0–36.0)
MCV: 86.7 fL (ref 80.0–100.0)
PLATELETS: 252 10*3/uL (ref 150–400)
RBC: 4.96 MIL/uL (ref 4.22–5.81)
RDW: 12.2 % (ref 11.5–15.5)
WBC: 7.1 10*3/uL (ref 4.0–10.5)
nRBC: 0 % (ref 0.0–0.2)

## 2018-04-22 LAB — BASIC METABOLIC PANEL
Anion gap: 10 (ref 5–15)
BUN: 14 mg/dL (ref 6–20)
CO2: 23 mmol/L (ref 22–32)
Calcium: 9.2 mg/dL (ref 8.9–10.3)
Chloride: 105 mmol/L (ref 98–111)
Creatinine, Ser: 0.82 mg/dL (ref 0.61–1.24)
GFR calc Af Amer: >60 mL/min (ref >60)
GFR calc non Af Amer: >60 mL/min (ref >60)
Glucose, Bld: 265 mg/dL — ABNORMAL HIGH (ref 70–99)
Potassium: 4.2 mmol/L (ref 3.5–5.1)
Sodium: 138 mmol/L (ref 135–145)

## 2018-04-22 LAB — CBG MONITORING, ED: Glucose-Capillary: 259 mg/dL — ABNORMAL HIGH (ref 70–99)

## 2018-04-22 LAB — VITAMIN B12: Vitamin B-12: 564 pg/mL (ref 180–914)

## 2018-04-22 MED ORDER — INSULIN GLARGINE 100 UNIT/ML ~~LOC~~ SOLN: SUBCUTANEOUS | 0 refills | Status: DC

## 2018-04-22 NOTE — ED Notes (Signed)
Patient verbalizes understanding of discharge instructions. Opportunity for questioning and answers were provided. Armband removed by staff, pt discharged from ED.  

## 2018-04-22 NOTE — ED Provider Notes (Signed)
[facility] Provider Note   CSN: [id number] Arrival date & time: 04/22/18  1111   History   Chief Complaint No chief complaint on file.   HPI Barry Phillips is a 48 y.o. male with history of TIIDM with b/l LE neuropathy and HTN presenting with intermittent episodes of right leg numbness and weakness and similar symptoms in his bilateral upper extremities with a feeling that he is might fall. This has been occurring for the past week and has never happened before. He states the symptoms last around 15 seconds. Afterward his extremities will feel weak, like he can't move them. He denies numbness and tingling at other times besides his chronic neuropathy which is worse in his left foot. He also normally takes insulin for his diabetes  He denies dizziness, nausea, vomiting, changes in BM, changes in medications  HPI  Past Medical History:  Diagnosis Date  . Diabetes mellitus without complication ([place])   . Hypertension     Patient Active Problem List   Diagnosis Date Noted  . Encounter for general adult medical examination with abnormal findings 05/26/2017  . Flat foot 02/19/2017  . Polyneuropathy 02/19/2017  . Type 2 diabetes mellitus ([place]) 09/18/2016  . Benign skin mole 09/18/2016    Past Surgical History:  Procedure Laterality Date  . FRACTURE SURGERY    . SHOULDER SURGERY Right         Home Medications    Prior to Admission medications   Medication Sig Start Date End Date Taking? Authorizing Provider  aspirin 81 MG chewable tablet Chew 81 mg by mouth daily.    [provider]  gabapentin (NEURONTIN) 100 MG capsule Take 1 capsule (100 mg total) by mouth 3 (three) times daily. 02/19/17   Rosemarie Ax, MD  glucose blood test strip Check twice a day 11/18/17   Elayne Snare, MD  insulin aspart (NOVOLOG FLEXPEN) 100 UNIT/ML FlexPen 10 units ac tid Patient not taking: Reported on 11/18/2017 07/07/17   Elayne Snare, MD  insulin  glargine (LANTUS) 100 UNIT/ML injection INJECT 16 UNITS UNDER THE SKIN ONCE DAILY. (NO REFILLS UNTIL PATIENT IS SEEN IN OFFICE.) 04/22/18   Elayne Snare, MD  Insulin Syringes, Disposable, U-100 1 ML MISC As directed 06/08/17   Lance Sell, NP  Lancets (BD LANCET ULTRAFINE 30G) MISC : Check sugar twice a day 11/23/17   Elayne Snare, MD  metFORMIN (GLUCOPHAGE) 1000 MG tablet Take 1 tablet (1,000 mg total) by mouth 2 (two) times daily. 05/14/17   Lance Sell, NP  RELION PEN NEEDLES 32G X 4 MM MISC USE 1  ONCE DAILY TO  INJECT  INSULIN 10/25/17   Lance Sell, NP  rosuvastatin (CRESTOR) 10 MG tablet Take 1 tablet (10 mg total) by mouth daily. 05/29/17   Marrian Salvage, FNP    Family History Family History  Problem Relation Age of Onset  . Diabetes Father     Social History Social History   Tobacco Use  . Smoking status: Current Some Day Smoker    Packs/day: 1.00    Years: 19.00    Pack years: 19.00    Types: Cigarettes  . Smokeless tobacco: Never Used  Substance Use Topics  . Alcohol use: Yes    Comment: Social  . Drug use: No     Allergies   Patient has no known allergies.   Review of Systems Review of Systems   Physical Exam Updated Vital Signs BP (!) 129/97  Pulse 68   Temp 98 F (36.7 C) (Oral)   Resp 17   Ht 6\' 3"  (1.905 m)   Wt 86.2 kg   SpO2 100%   BMI 23.75 kg/m   Physical Exam   ED Treatments / Results  Labs (all labs ordered are listed, but only abnormal results are displayed) Labs Reviewed  BASIC METABOLIC PANEL - Abnormal; Notable for the following components:      Result Value   Glucose, Bld 265 (*)    All other components within normal limits  CBG MONITORING, ED - Abnormal; Notable for the following components:   Glucose-Capillary 259 (*)    All other components within normal limits  CBC  VITAMIN B12    EKG EKG Interpretation  Date/Time:  Friday April 22 2018 13:07:53 EST Ventricular Rate:  65 PR  Interval:    QRS Duration: 102 QT Interval:  390 QTC Calculation: 406 R Axis:   90 Text Interpretation:  Sinus rhythm Borderline right axis deviation Probable anteroseptal infarct, old Confirmed by Lacretia Leigh ([id number]) on 04/22/2018 1:33:42 PM   Radiology No results found.  Procedures Procedures (including critical care time)  Medications Ordered in ED Medications - No data to display   Initial Impression / Assessment and Plan / ED Course  I have reviewed the triage vital signs and the nursing notes.  Pertinent labs & imaging results that were available during my care of the patient were reviewed by me and considered in my medical decision making (see chart for details).     48yo male with HTN and uncontrolled TIIDM with peripheral neuropathy presenting with intermittent episodes of numbness in his right leg and hands bilaterally for the past week that last for around 15 seconds. No neurological deficits or current focal weakness. His labs do not show current anemia, B12 is within normal limits. He is hyperglycemic but has no symptoms or signs of DKA. His symptoms appear likely to be related to his uncontrolled diabetes. He has been unable to afford his lantus recently but has follow-up with his primary care physician at the end of February. Discussed that he may be able to afford over the counter insulin until his follow-up with PCP, recommended consulting with pharmacist and his primary care physician to see if he can find a more affordable option at this time. Return precautions given.   Final Clinical Impressions(s) / ED Diagnoses   Final diagnoses:  Hyperglycemia  Neuropathy    ED Discharge Orders    None       Seawell, Jaimie A, DO 04/22/18 1510    Lacretia Leigh, MD 04/23/18 1451

## 2018-04-22 NOTE — Telephone Encounter (Signed)
Rx sent and note that no refills will be sent until patient is seen in office.

## 2018-04-22 NOTE — ED Provider Notes (Addendum)
I saw and evaluated the patient, reviewed the resident's note and I agree with the findings and plan.  EKG: EKG Interpretation  Date/Time:  Friday April 22 2018 13:07:53 EST Ventricular Rate:  65 PR Interval:    QRS Duration: 102 QT Interval:  390 QTC Calculation: 406 R Axis:   90 Text Interpretation:  Sinus rhythm Borderline right axis deviation Probable anteroseptal infarct, old Confirmed by Lacretia Leigh [phone]) on 04/22/2018 1:41:90 PM 48 year old male presents with intermittent paresthesias arms or legs that are worse when he works and does heavy lifting.  On exam he has no focal deficits.  Suspect worsening diabetic neuropathy.  Will check patient's electrolytes as he has been noncompliant with his insulin.  Likely discharge home  PE:  CV--reg rate  Lungs--clear Abd--soft Neuro--no focal deficits Sheran Lawless, MD 04/22/18 1334    Lacretia Leigh, MD 05/09/18 2316

## 2018-04-22 NOTE — Discharge Instructions (Addendum)
Your Type II Diabetes is currently not controlled as you have not been taking your Lantus or Novolog. You were previously prescribed 16U of Lantus and 10U of Novolog three times per day.   Please visit your local pharmacy and ask about pricing of over the counter insulin and the equivalent to your Novolog and Lantus until your follow-up appointment with your primary care physician.   If you experience worsening of your symptoms please call your PCP or return to the emergency department.

## 2018-04-22 NOTE — Telephone Encounter (Signed)
MEDICATION: Lantus  PHARMACY:  [facility]  IS THIS A 90 DAY SUPPLY : NO  IS PATIENT OUT OF MEDICATION: YES  IF NOT; HOW MUCH IS LEFT:   LAST APPOINTMENT DATE: @4 /16/19  NEXT APPOINTMENT DATE:@2 /21/2020  DO WE HAVE YOUR PERMISSION TO LEAVE A DETAILED MESSAGE:  OTHER COMMENTS: 30 day supply only per patient request    **Let patient know to contact pharmacy at the end of the day to make sure medication is ready. **  ** Please notify patient to allow 48-72 hours to process**  **Encourage patient to contact the pharmacy for refills or they can request refills through [facility]**

## 2018-04-22 NOTE — ED Triage Notes (Signed)
Pt in c/o bil leg, hands, and arm stiffness worse with movement lasting 5-10 secs intermittently for 1 wk, pt reports being out of insulin for 2 wks with BS in 300s, pt reports neuropathy, denies n/v/d, denies SOB, A&O x4

## 2018-04-23 ENCOUNTER — Encounter (HOSPITAL_COMMUNITY): Payer: Self-pay | Admitting: *Deleted

## 2018-04-23 ENCOUNTER — Other Ambulatory Visit: Payer: Self-pay

## 2018-04-23 ENCOUNTER — Emergency Department (HOSPITAL_COMMUNITY): Payer: BLUE CROSS/BLUE SHIELD

## 2018-04-23 ENCOUNTER — Emergency Department (HOSPITAL_COMMUNITY)
Admission: EM | Admit: 2018-04-23 | Discharge: 2018-04-23 | Disposition: A | Discharge status: Home / Self Care | Payer: BLUE CROSS/BLUE SHIELD | Attending: Emergency Medicine | Admitting: Emergency Medicine

## 2018-04-23 DIAGNOSIS — R251 Tremor, unspecified: Secondary | ICD-10-CM

## 2018-04-23 DIAGNOSIS — F17210 Nicotine dependence, cigarettes, uncomplicated: Secondary | ICD-10-CM | POA: Insufficient documentation

## 2018-04-23 DIAGNOSIS — Z794 Long term (current) use of insulin: Secondary | ICD-10-CM | POA: Insufficient documentation

## 2018-04-23 DIAGNOSIS — I10 Essential (primary) hypertension: Secondary | ICD-10-CM | POA: Insufficient documentation

## 2018-04-23 DIAGNOSIS — R202 Paresthesia of skin: Secondary | ICD-10-CM | POA: Diagnosis not present

## 2018-04-23 DIAGNOSIS — Z79899 Other long term (current) drug therapy: Secondary | ICD-10-CM | POA: Insufficient documentation

## 2018-04-23 DIAGNOSIS — Z7982 Long term (current) use of aspirin: Secondary | ICD-10-CM | POA: Diagnosis not present

## 2018-04-23 DIAGNOSIS — E1165 Type 2 diabetes mellitus with hyperglycemia: Secondary | ICD-10-CM | POA: Insufficient documentation

## 2018-04-23 DIAGNOSIS — R259 Unspecified abnormal involuntary movements: Secondary | ICD-10-CM | POA: Diagnosis not present

## 2018-04-23 DIAGNOSIS — R739 Hyperglycemia, unspecified: Secondary | ICD-10-CM

## 2018-04-23 LAB — COMPREHENSIVE METABOLIC PANEL
ALT: 15 U/L (ref 0–44)
AST: 17 U/L (ref 15–41)
Albumin: 3.6 g/dL (ref 3.5–5.0)
Alkaline Phosphatase: 94 U/L (ref 38–126)
Anion gap: 11 (ref 5–15)
BUN: 18 mg/dL (ref 6–20)
CO2: 25 mmol/L (ref 22–32)
CREATININE: 0.93 mg/dL (ref 0.61–1.24)
Calcium: 9.4 mg/dL (ref 8.9–10.3)
Chloride: 99 mmol/L (ref 98–111)
GFR calc non Af Amer: >60 mL/min (ref >60)
Glucose, Bld: 301 mg/dL — ABNORMAL HIGH (ref 70–99)
Potassium: 4.2 mmol/L (ref 3.5–5.1)
Sodium: 135 mmol/L (ref 135–145)
Total Bilirubin: 1 mg/dL (ref 0.3–1.2)
Total Protein: 7.1 g/dL (ref 6.5–8.1)

## 2018-04-23 NOTE — ED Triage Notes (Signed)
Pt reports he came to ED on Friday with same Bil. Leg pain and tingling  to both legs. Pt reports pain is worse.

## 2018-04-23 NOTE — ED Provider Notes (Addendum)
[facility] EMERGENCY DEPARTMENT Provider Note   CSN: [id number] Arrival date & time: 04/23/18  1812     History   Chief Complaint Chief Complaint  Patient presents with  . Leg Pain    HPI Hershal Eriksson is a 48 y.o. male.  Patient c/o intermittent episodes of feeling generally shaky, and tremors in bil arms/legs for the past 2 weeks. States episodes might occur 3-4x/day, and last 10-30 seconds per episode. Does not lose postural tone, and has remained ambulatory with purposeful movement during episodes. No mental confusion or loc with episodes. No trauma or fall. No incontinence. Denies change in speech or vision. No persistent, focal or unilateral numbness or weakness - notes paresthesias to bil hands/feet 'for awhile'. Denies headache. No neck or back pain. No fever or chills. No recent tick bites. No recent wounds. No unusual stressors/depression, denies etoh abuse/daily.   The history is provided by the patient.  Leg Pain  Associated symptoms: no back pain, no fever and no neck pain     Past Medical History:  Diagnosis Date  . Diabetes mellitus without complication ([place])   . Hypertension     Patient Active Problem List   Diagnosis Date Noted  . Encounter for general adult medical examination with abnormal findings 05/26/2017  . Flat foot 02/19/2017  . Polyneuropathy 02/19/2017  . Type 2 diabetes mellitus ([place]) 09/18/2016  . Benign skin mole 09/18/2016    Past Surgical History:  Procedure Laterality Date  . FRACTURE SURGERY    . SHOULDER SURGERY Right         Home Medications    Prior to Admission medications   Medication Sig Start Date End Date Taking? Authorizing Provider  aspirin 81 MG chewable tablet Chew 81 mg by mouth daily.    [provider]  gabapentin (NEURONTIN) 100 MG capsule Take 1 capsule (100 mg total) by mouth 3 (three) times daily. 02/19/17   Rosemarie Ax, MD  glucose blood test strip Check twice a day 11/18/17    Elayne Snare, MD  insulin aspart (NOVOLOG FLEXPEN) 100 UNIT/ML FlexPen 10 units ac tid Patient not taking: Reported on 11/18/2017 07/07/17   Elayne Snare, MD  insulin glargine (LANTUS) 100 UNIT/ML injection INJECT 16 UNITS UNDER THE SKIN ONCE DAILY. (NO REFILLS UNTIL PATIENT IS SEEN IN OFFICE.) 04/22/18   Elayne Snare, MD  Insulin Syringes, Disposable, U-100 1 ML MISC As directed 06/08/17   Lance Sell, NP  Lancets (BD LANCET ULTRAFINE 30G) MISC : Check sugar twice a day 11/23/17   Elayne Snare, MD  metFORMIN (GLUCOPHAGE) 1000 MG tablet Take 1 tablet (1,000 mg total) by mouth 2 (two) times daily. 05/14/17   Lance Sell, NP  RELION PEN NEEDLES 32G X 4 MM MISC USE 1  ONCE DAILY TO  INJECT  INSULIN 10/25/17   Lance Sell, NP  rosuvastatin (CRESTOR) 10 MG tablet Take 1 tablet (10 mg total) by mouth daily. 05/29/17   Marrian Salvage, FNP    Family History Family History  Problem Relation Age of Onset  . Diabetes Father     Social History Social History   Tobacco Use  . Smoking status: Current Some Day Smoker    Packs/day: 1.00    Years: 19.00    Pack years: 19.00    Types: Cigarettes  . Smokeless tobacco: Never Used  Substance Use Topics  . Alcohol use: Yes    Comment: Social  . Drug use: No  Allergies   Patient has no known allergies.   Review of Systems Review of Systems  Constitutional: Negative for chills and fever.  HENT: Negative for sore throat and trouble swallowing.   Eyes: Negative for pain, redness and visual disturbance.       No eye pain or visual changes/symptoms.   Respiratory: Negative for cough and shortness of breath.   Cardiovascular: Negative for chest pain.  Gastrointestinal: Negative for abdominal pain, diarrhea and vomiting.  Endocrine: Negative for polyuria.  Genitourinary: Negative for flank pain.  Musculoskeletal: Negative for back pain and neck pain.  Skin: Negative for rash.  Neurological: Positive for tremors. Negative  for syncope, speech difficulty, weakness and headaches.       No trouble swallowing. No trouble speaking or slurred speech. Notes paresthesia bil hands, feet - states that has been going on longer - no one sided numbness/weakness. No trunk or prox extremity numbness/paresthesia.   Hematological: Does not bruise/bleed easily.  Psychiatric/Behavioral: Negative for confusion.     Physical Exam Updated Vital Signs BP (!) 138/91 (BP Location: Right Arm)   Pulse 70   Temp 98.5 F (36.9 C) (Oral)   Resp 18   Ht 1.905 m (6\' 3" )   Wt 86.2 kg   SpO2 99%   BMI 23.75 kg/m   Physical Exam Vitals signs and nursing note reviewed.  Constitutional:      Appearance: Normal appearance. He is well-developed.  HENT:     Head: Atraumatic.     Nose: Nose normal.     Mouth/Throat:     Mouth: Mucous membranes are moist.     Pharynx: Oropharynx is clear.  Eyes:     General: No scleral icterus.    Extraocular Movements: Extraocular movements intact.     Conjunctiva/sclera: Conjunctivae normal.     Pupils: Pupils are equal, round, and reactive to light.  Neck:     Musculoskeletal: Normal range of motion and neck supple. No neck rigidity or muscular tenderness.     Vascular: No carotid bruit.     Trachea: No tracheal deviation.  Cardiovascular:     Rate and Rhythm: Normal rate and regular rhythm.     Pulses: Normal pulses.     Heart sounds: Normal heart sounds. No murmur. No friction rub. No gallop.   Pulmonary:     Effort: Pulmonary effort is normal. No accessory muscle usage or respiratory distress.     Breath sounds: Normal breath sounds.  Abdominal:     General: Bowel sounds are normal. There is no distension.     Palpations: Abdomen is soft. There is no mass.     Tenderness: There is no abdominal tenderness. There is no guarding.  Genitourinary:    Comments: No cva tenderness. Musculoskeletal:        General: No swelling or tenderness.     Right lower leg: No edema.     Left lower leg:  No edema.     Comments: CTLS spine, non tender, aligned, no step off.   Lymphadenopathy:     Cervical: No cervical adenopathy.  Skin:    General: Skin is warm and dry.     Findings: No rash.  Neurological:     General: No focal deficit present.     Mental Status: He is alert and oriented to person, place, and time.     Comments: Alert, speech clear/fluent. Motor intact bil, stre 5/5. No pronator drift. Sensation grossly intact bil. Reflexes 2+ bil. Steady gait. No tremors  noted.   Psychiatric:        Mood and Affect: Mood normal.      ED Treatments / Results  Labs (all labs ordered are listed, but only abnormal results are displayed) Results for orders placed or performed during the hospital encounter of 04/23/18  Comprehensive metabolic panel  Result Value Ref Range   Sodium 135 135 - 145 mmol/L   Potassium 4.2 3.5 - 5.1 mmol/L   Chloride 99 98 - 111 mmol/L   CO2 25 22 - 32 mmol/L   Glucose, Bld 301 (H) 70 - 99 mg/dL   BUN 18 6 - 20 mg/dL   Creatinine, Ser 0.93 0.61 - 1.24 mg/dL   Calcium 9.4 8.9 - 10.3 mg/dL   Total Protein 7.1 6.5 - 8.1 g/dL   Albumin 3.6 3.5 - 5.0 g/dL   AST 17 15 - 41 U/L   ALT 15 0 - 44 U/L   Alkaline Phosphatase 94 38 - 126 U/L   Total Bilirubin 1.0 0.3 - 1.2 mg/dL   GFR calc non Af Amer >60 >60 mL/min   GFR calc Af Amer >60 >60 mL/min   Anion gap 11 5 - 15   Ct Head Wo Contrast  Result Date: 04/23/2018 CLINICAL DATA:  Leg pain and tingling EXAM: CT HEAD WITHOUT CONTRAST TECHNIQUE: Contiguous axial images were obtained from the base of the skull through the vertex without intravenous contrast. COMPARISON:  None. FINDINGS: Brain: Ventricles and sulci are appropriate for patient's age. No evidence for acute cortically based infarct, intracranial hemorrhage, mass lesion or mass-effect. Vascular: Unremarkable Skull: Intact Sinuses/Orbits: Paranasal sinuses are well aerated. Mastoid air cells are unremarkable. Orbits are unremarkable. Other: None.  IMPRESSION: No acute intracranial process. Electronically Signed   By: Lovey Newcomer M.D.   On: 04/23/2018 19:59    EKG None  Radiology Ct Head Wo Contrast  Result Date: 04/23/2018 CLINICAL DATA:  Leg pain and tingling EXAM: CT HEAD WITHOUT CONTRAST TECHNIQUE: Contiguous axial images were obtained from the base of the skull through the vertex without intravenous contrast. COMPARISON:  None. FINDINGS: Brain: Ventricles and sulci are appropriate for patient's age. No evidence for acute cortically based infarct, intracranial hemorrhage, mass lesion or mass-effect. Vascular: Unremarkable Skull: Intact Sinuses/Orbits: Paranasal sinuses are well aerated. Mastoid air cells are unremarkable. Orbits are unremarkable. Other: None. IMPRESSION: No acute intracranial process. Electronically Signed   By: Lovey Newcomer M.D.   On: 04/23/2018 19:59    Procedures Procedures (including critical care time)  Medications Ordered in ED Medications - No data to display   Initial Impression / Assessment and Plan / ED Course  I have reviewed the triage vital signs and the nursing notes.  Pertinent labs & imaging results that were available during my care of the patient were reviewed by me and considered in my medical decision making (see chart for details).  Labs sent.  Reviewed nursing notes and prior charts for additional history.  Reviewed additional labs from yesterday.   Labs reviewed - glucose elv, hco3 and chem normal.  CT reviewed - no acute process.   No shaking/tremor in ED, non focal neuro exam. Patient ambulated in ED, no tremors, no ataxia, gait steady.   Pt currently appears stable for d/c.   Will refer to close outpt neurology f/u. Return precautions provided.     Final Clinical Impressions(s) / ED Diagnoses   Final diagnoses:  None    ED Discharge Orders    None  Lajean Saver, MD 04/23/18 2050

## 2018-04-23 NOTE — Discharge Instructions (Signed)
It was our pleasure to provide your ER care today - we hope that you feel better.  Your blood sugar is high (300) - drink plenty of water, take insulin as prescribed, follow diabetic meal/eating plan, check sugars 4x/day and record values, and follow up with primary care doctor this week.   For recent symptoms, follow up with neurologist in the next 1-2 weeks - see referral - call office Monday AM to arrange appointment.   Return to ER if worse, new symptoms, high fevers, new numbness/weakness, trouble breathing, other concern.

## 2018-04-25 ENCOUNTER — Telehealth: Payer: Self-pay | Admitting: Endocrinology

## 2018-04-25 NOTE — Telephone Encounter (Signed)
[name] (wife) would like a call back to update Dr Dwyane Dee on what is going on with the patient. She stated that she had to take him to the hospital twice over the last few days  Please adivse

## 2018-04-25 NOTE — Telephone Encounter (Signed)
Currently has appt on 05/06/2018, do you want to see him sooner?

## 2018-04-25 NOTE — Telephone Encounter (Signed)
Pt's wife was called back and pt's wife stated that he was in the ER twice this past weekend for "uncontrollable" shaking. Pt was advised to see nuerology and he currently has an appt on Wednesday. He was also advised to call Dr. Ronnie Derby office and inform him of this and see if Dr. Dwyane Dee would like to see him as well. Please advise.

## 2018-04-25 NOTE — Telephone Encounter (Signed)
Noted, he is overdue for his follow-up anyway

## 2018-04-26 NOTE — Telephone Encounter (Signed)
Called pt and informed him of this. Pt verbalized understanding.

## 2018-04-26 NOTE — Telephone Encounter (Signed)
He can keep the appointment as symptoms are not related to diabetes

## 2018-04-27 ENCOUNTER — Ambulatory Visit: Payer: BLUE CROSS/BLUE SHIELD | Admitting: Neurology

## 2018-04-27 ENCOUNTER — Encounter: Payer: Self-pay | Admitting: Neurology

## 2018-04-27 VITALS — BP 131/93 | HR 73 | Ht 75.0 in | Wt 197.0 lb

## 2018-04-27 DIAGNOSIS — R251 Tremor, unspecified: Secondary | ICD-10-CM

## 2018-04-27 NOTE — Progress Notes (Signed)
Subjective:    Patient ID: Barry Phillips is a 48 y.o. male.  HPI     Star Age, MD, PhD [facility] [address] [place], [place] [address]  I saw patient, Barry Phillips, as a referral from the emergency room. The patient is accompanied by his wife today. He presented to the emergency room on 04/22/2018 as well as 04/23/2018 with a history of trembling. He is a 48 year old right-handed gentleman with an underlying medical history of hypertension, uncontrolled diabetes, and diabetic neuropathy, who reports episodes of trembling affecting his arms and legs. He can be standing, walking or lying down. He has had FSBS values in the 270s to 280s at the time, lasts about 30 seconds, does not lose consciousness, no falls, is able to talke. Feels like a certain movement in the lower back brings it on. Has not seen a spine specialist. No staring spells, feels some SOB during that time. His last A1c was 11.5 in September 2019. He has been on gabapentin for numbness and tingling, affecting both hands and feet.  He had a head CT without contrast on 04/23/2018 and I reviewed the results: IMPRESSION: No acute intracranial process. Blood work from 04/23/2018 as well as 04/22/2018 was reviewed, blood sugar level on 04/23/2018 was 301. BUN and creatinine were unremarkable. B12 level was 564 on 04/22/2018. He has not had seizures as a child, no FHx of epilepsy, maybe a remote cousin with childhood seizures.  He has no upper back or mid back pain. He feels that a certain movement can bring it on. He quit smoking on 04/23/18, alcohol about 2 times a year, one caffeinated drink per day on average.   His Past Medical History Is Significant For: Past Medical History:  Diagnosis Date  . Diabetes mellitus without complication ([place])   . Hypertension     His Past Surgical History Is Significant For: Past Surgical History:  Procedure Laterality Date  . FRACTURE  SURGERY    . SHOULDER SURGERY Right     His Family History Is Significant For: Family History  Problem Relation Age of Onset  . Diabetes Father     His Social History Is Significant For: Social History   Socioeconomic History  . Marital status: Married    Spouse name: Not on file  . Number of children: 3  . Years of education: GED  . Highest education level: Not on file  Occupational History  . Occupation: Oncologist  Social Needs  . Financial resource strain: Not on file  . Food insecurity:    Worry: Not on file    Inability: Not on file  . Transportation needs:    Medical: Not on file    Non-medical: Not on file  Tobacco Use  . Smoking status: Current Some Day Smoker    Packs/day: 1.00    Years: 19.00    Pack years: 19.00    Types: Cigarettes  . Smokeless tobacco: Never Used  Substance and Sexual Activity  . Alcohol use: Yes    Comment: Social  . Drug use: No  . Sexual activity: Not on file  Lifestyle  . Physical activity:    Days per week: Not on file    Minutes per session: Not on file  . Stress: Not on file  Relationships  . Social connections:    Talks on phone: Not on file    Gets together: Not on file    Attends religious service: Not  on file    Active member of club or organization: Not on file    Attends meetings of clubs or organizations: Not on file    Relationship status: Not on file  Other Topics Concern  . Not on file  Social History Narrative   Fun/Hobby: Play with his daughters and family time.     His Allergies Are:  No Known Allergies:   His Current Medications Are:  Outpatient Encounter Medications as of 04/27/2018  Medication Sig  . aspirin 81 MG chewable tablet Chew 81 mg by mouth daily.  Marland Kitchen glucose blood test strip Check twice a day  . insulin aspart (NOVOLOG FLEXPEN) 100 UNIT/ML FlexPen 10 units ac tid  . insulin glargine (LANTUS) 100 UNIT/ML injection INJECT 16 UNITS UNDER THE SKIN ONCE DAILY. (NO REFILLS UNTIL PATIENT IS  SEEN IN OFFICE.)  . Insulin Syringes, Disposable, U-100 1 ML MISC As directed  . Lancets (BD LANCET ULTRAFINE 30G) MISC : Check sugar twice a day  . RELION PEN NEEDLES 32G X 4 MM MISC USE 1  ONCE DAILY TO  INJECT  INSULIN  . gabapentin (NEURONTIN) 100 MG capsule Take 1 capsule (100 mg total) by mouth 3 (three) times daily. (Patient not taking: Reported on 04/27/2018)  . rosuvastatin (CRESTOR) 10 MG tablet Take 1 tablet (10 mg total) by mouth daily. (Patient not taking: Reported on 04/27/2018)  . [DISCONTINUED] metFORMIN (GLUCOPHAGE) 1000 MG tablet Take 1 tablet (1,000 mg total) by mouth 2 (two) times daily.   No facility-administered encounter medications on file as of 04/27/2018.   : Review of Systems:  Out of a complete 14 point review of systems, all are reviewed and negative with the exception of these symptoms as listed below: Review of Systems  Neurological:       Pt presents today to discuss his episodes of full body trembling. Pt believes this may be caused by a pinched nerve in his back. Pt reports the episodes happen more frequently in the evening or nighttime.    Objective:  Neurological Exam  Physical Exam Physical Examination:   Vitals:   04/27/18 1323  BP: (!) 131/93  Pulse: 73    General Examination: The patient is a very pleasant 48 y.o. male in no acute distress. He appears well-developed and well-nourished and adequately groomed.   HEENT: Normocephalic, atraumatic, pupils are equal, round and reactive to light and accommodation. Funduscopic exam is normal with sharp disc margins noted. Extraocular tracking is good without limitation to gaze excursion or nystagmus noted. Normal smooth pursuit is noted. Hearing is grossly intact. Face, symmetric with normal facial animation and normal facial sensation. Speech is clear with no dysarthria noted. There is no hypophonia. There is no lip, neck/head, jaw or voice tremor. Neck is supple with full range of passive and active  motion. There are no carotid bruits on auscultation. Oropharynx exam reveals: mild mouth dryness, adequate dental hygiene. Tongue protrudes centrally and palate elevates symmetrically.   Chest: Clear to auscultation without wheezing, rhonchi or crackles noted.  Heart: S1+S2+0, regular and normal without murmurs, rubs or gallops noted.   Abdomen: Soft, non-tender and non-distended with normal bowel sounds appreciated on auscultation.  Extremities: There is no pitting edema in the distal lower extremities bilaterally. Pedal pulses are intact.  Skin: Warm and dry without trophic changes noted.  Musculoskeletal: exam reveals no obvious joint deformities, tenderness or joint swelling or erythema.   Neurologically:  Mental status: The patient is awake, alert and oriented in all  4 spheres. His immediate and remote memory, attention, language skills and fund of knowledge are appropriate. There is no evidence of aphasia, agnosia, apraxia or anomia. Speech is clear with normal prosody and enunciation. Thought process is linear. Mood is normal and affect is normal.  Cranial nerves II - XII are as described above under HEENT exam. In addition: shoulder shrug is normal with equal shoulder height noted. Motor exam: Normal bulk, strength and tone is noted. There is no drift. Romberg is negative. He has no resting, postural or action tremor, no intention tremor. Reflexes are 1+ in the UEs, trace in the knees, absent in the ankles. Fine motor skills and coordination: intact with normal finger taps, normal hand movements, normal rapid alternating patting, normal foot taps and normal foot agility.  Cerebellar testing: No dysmetria or intention tremor on finger to nose testing. Heel to shin is unremarkable bilaterally. There is no truncal or gait ataxia.  Sensory exam: intact to light touch, pinprick, vibration, temperature sense in the upper extremities and Diminished to pinprick and temperature sense in the distal  lower extremities up to mid calf areas. Vibration sense and light touch are preserved in the lower extremities.  Gait, station and balance: He stands easily. No veering to one side is noted. No leaning to one side is noted. Posture is age-appropriate and stance is narrow based. Gait shows normal stride length and normal pace. No problems turning are noted. Tandem walk is unremarkable.   Assessment and Plan:   In summary, Mykai Monta Lindfors is a very pleasant 48 y.o.-year old male with an underlying medical history of hypertension, uncontrolled diabetes, and diabetic neuropathy, who presents for evaluations of short periods of trembling. These occur at different times of day, also a different positions, when sitting, standing or walking even. His history and examination do not suggest essential tremor or parkinsonism and he is reassured in that regard. He has a largely nonfocal neurological exam but does have a history of neuropathy and has exam findings in keeping with peripheral neuropathy, likely diabetic, he does have suboptimal diabetes control and is encouraged to follow through with his endocrinology appointments and work on optimizing his diabetes control. Also blood sugar fluctuations could cause episodic trembling. His history and description of the events are not in keeping with seizure-like events. Nevertheless, I suggested further workup from my end of things with EEG and MRI brain. We will keep them posted as to his test results. I will see him in follow-up if needed, particularly based on his test results. He does wonder if he has a pinched nerve in the back. Sometimes a certain movement in the mid or upper back and sometimes in the lower back seems to bring on the trembling. He denies radiating back pain or neck pain. If he has any back related concerns he is encouraged to talk to his primary care physician about potentially seeing a spine specialist. I answered all their questions today and  the patient and his wife were in agreement.  Star Age, MD, PhD

## 2018-04-27 NOTE — Patient Instructions (Addendum)
Your neurological exam does not suggest stroke like changes. You do have a history and exam findings for neuropathy (nerve damage), likely from the uncontrolled diabetes.  You do not have a tremor currently. Blood sugar fluctuations can cause temporary trembling. Your history and video do not suggest seizures.  I would recommend we proceed with a brain scan, called MRI and call you with the test results. We will have to schedule you for this on a separate date. This test requires authorization from your insurance, and we will take care of the insurance process. We will also do an EEG (brainwave test), which we will schedule. We will call you with the results. For now, we will keep you posted as to your test results and I will see you follow up if needed.

## 2018-04-28 ENCOUNTER — Telehealth: Payer: Self-pay | Admitting: Neurology

## 2018-04-28 NOTE — Telephone Encounter (Signed)
[employer] [place] auth: npr via [employer] [place] of [place] ref # [id number] order sent to GI

## 2018-04-29 ENCOUNTER — Ambulatory Visit: Payer: BLUE CROSS/BLUE SHIELD | Admitting: Nurse Practitioner

## 2018-04-29 ENCOUNTER — Encounter: Payer: Self-pay | Admitting: Nurse Practitioner

## 2018-04-29 ENCOUNTER — Telehealth: Payer: Self-pay | Admitting: Endocrinology

## 2018-04-29 ENCOUNTER — Ambulatory Visit (INDEPENDENT_AMBULATORY_CARE_PROVIDER_SITE_OTHER)
Admission: RE | Admit: 2018-04-29 | Discharge: 2018-04-29 | Disposition: A | Discharge status: Home / Self Care | Payer: BLUE CROSS/BLUE SHIELD | Source: Ambulatory Visit | Attending: Nurse Practitioner | Admitting: Nurse Practitioner

## 2018-04-29 ENCOUNTER — Telehealth: Payer: Self-pay | Admitting: Nurse Practitioner

## 2018-04-29 VITALS — BP 120/84 | HR 86 | Ht 75.0 in | Wt 195.0 lb

## 2018-04-29 DIAGNOSIS — R251 Tremor, unspecified: Secondary | ICD-10-CM

## 2018-04-29 DIAGNOSIS — G8929 Other chronic pain: Secondary | ICD-10-CM | POA: Diagnosis not present

## 2018-04-29 DIAGNOSIS — E1142 Type 2 diabetes mellitus with diabetic polyneuropathy: Secondary | ICD-10-CM | POA: Diagnosis not present

## 2018-04-29 DIAGNOSIS — M5442 Lumbago with sciatica, left side: Secondary | ICD-10-CM

## 2018-04-29 DIAGNOSIS — M5441 Lumbago with sciatica, right side: Secondary | ICD-10-CM | POA: Diagnosis not present

## 2018-04-29 DIAGNOSIS — M545 Low back pain: Secondary | ICD-10-CM | POA: Diagnosis not present

## 2018-04-29 NOTE — Patient Instructions (Signed)
Head downstairs for xray  Keep planned follow up with endocrinology and neurology   Back Exercises If you have pain in your back, do these exercises 2-3 times each day or as told by your doctor. When the pain goes away, do the exercises once each day, but repeat the steps more times for each exercise (do more repetitions). If you do not have pain in your back, do these exercises once each day or as told by your doctor. Exercises Single Knee to Chest Do these steps 3-5 times in a row for each leg: 1. Lie on your back on a firm bed or the floor with your legs stretched out. 2. Bring one knee to your chest. 3. Hold your knee to your chest by grabbing your knee or thigh. 4. Pull on your knee until you feel a gentle stretch in your lower back. 5. Keep doing the stretch for 10-30 seconds. 6. Slowly let go of your leg and straighten it. Pelvic Tilt Do these steps 5-10 times in a row: 1. Lie on your back on a firm bed or the floor with your legs stretched out. 2. Bend your knees so they point up to the ceiling. Your feet should be flat on the floor. 3. Tighten your lower belly (abdomen) muscles to press your lower back against the floor. This will make your tailbone point up to the ceiling instead of pointing down to your feet or the floor. 4. Stay in this position for 5-10 seconds while you gently tighten your muscles and breathe evenly. Cat-Cow Do these steps until your lower back bends more easily: 1. Get on your hands and knees on a firm surface. Keep your hands under your shoulders, and keep your knees under your hips. You may put padding under your knees. 2. Let your head hang down, and make your tailbone point down to the floor so your lower back is round like the back of a cat. 3. Stay in this position for 5 seconds. 4. Slowly lift your head and make your tailbone point up to the ceiling so your back hangs low (sags) like the back of a cow. 5. Stay in this position for 5  seconds.  Press-Ups Do these steps 5-10 times in a row: 1. Lie on your belly (face-down) on the floor. 2. Place your hands near your head, about shoulder-width apart. 3. While you keep your back relaxed and keep your hips on the floor, slowly straighten your arms to raise the top half of your body and lift your shoulders. Do not use your back muscles. To make yourself more comfortable, you may change where you place your hands. 4. Stay in this position for 5 seconds. 5. Slowly return to lying flat on the floor.  Bridges Do these steps 10 times in a row: 1. Lie on your back on a firm surface. 2. Bend your knees so they point up to the ceiling. Your feet should be flat on the floor. 3. Tighten your butt muscles and lift your butt off of the floor until your waist is almost as high as your knees. If you do not feel the muscles working in your butt and the back of your thighs, slide your feet 1-2 inches farther away from your butt. 4. Stay in this position for 3-5 seconds. 5. Slowly lower your butt to the floor, and let your butt muscles relax. If this exercise is too easy, try doing it with your arms crossed over your chest. Belly Crunches  Do these steps 5-10 times in a row: 1. Lie on your back on a firm bed or the floor with your legs stretched out. 2. Bend your knees so they point up to the ceiling. Your feet should be flat on the floor. 3. Cross your arms over your chest. 4. Tip your chin a little bit toward your chest but do not bend your neck. 5. Tighten your belly muscles and slowly raise your chest just enough to lift your shoulder blades a tiny bit off of the floor. 6. Slowly lower your chest and your head to the floor. Back Lifts Do these steps 5-10 times in a row: 1. Lie on your belly (face-down) with your arms at your sides, and rest your forehead on the floor. 2. Tighten the muscles in your legs and your butt. 3. Slowly lift your chest off of the floor while you keep your hips  on the floor. Keep the back of your head in line with the curve in your back. Look at the floor while you do this. 4. Stay in this position for 3-5 seconds. 5. Slowly lower your chest and your face to the floor. Contact a doctor if:  Your back pain gets a lot worse when you do an exercise.  Your back pain does not lessen 2 hours after you exercise. If you have any of these problems, stop doing the exercises. Do not do them again unless your doctor says it is okay. Get help right away if:  You have sudden, very bad back pain. If this happens, stop doing the exercises. Do not do them again unless your doctor says it is okay. This information is not intended to replace advice given to you by your health care provider. Make sure you discuss any questions you have with your health care provider. Document Released: 04/04/2010 Document Revised: 11/24/2017 Document Reviewed: 04/26/2014 Elsevier Interactive Patient Education  [employer].

## 2018-04-29 NOTE — Telephone Encounter (Signed)
Patient stated he is needing a call back to discuss his fast acting insulin. He is also wanting to know about getting vials of insulin  Please advise

## 2018-04-29 NOTE — Progress Notes (Signed)
Barry Phillips is a 48 y.o. male with the following history as recorded in EpicCare:  Patient Active Problem List   Diagnosis Date Noted  . Encounter for general adult medical examination with abnormal findings 05/26/2017  . Flat foot 02/19/2017  . Polyneuropathy 02/19/2017  . Type 2 diabetes mellitus ([place]) 09/18/2016  . Benign skin mole 09/18/2016    Current Outpatient Medications  Medication Sig Dispense Refill  . aspirin 81 MG chewable tablet Chew 81 mg by mouth daily.    Marland Kitchen glucose blood test strip Check twice a day 100 each 12  . insulin glargine (LANTUS) 100 UNIT/ML injection INJECT 16 UNITS UNDER THE SKIN ONCE DAILY. (NO REFILLS UNTIL PATIENT IS SEEN IN OFFICE.) 1 vial 0  . Insulin Syringes, Disposable, U-100 1 ML MISC As directed 100 each 0  . Lancets (BD LANCET ULTRAFINE 30G) MISC : Check sugar twice a day 100 each 3  . RELION PEN NEEDLES 32G X 4 MM MISC USE 1  ONCE DAILY TO  INJECT  INSULIN 100 each 0  . rosuvastatin (CRESTOR) 10 MG tablet Take 1 tablet (10 mg total) by mouth daily. 90 tablet 0  . gabapentin (NEURONTIN) 100 MG capsule Take 1 capsule (100 mg total) by mouth 3 (three) times daily. (Patient not taking: Reported on 04/29/2018) 90 capsule 3  . insulin aspart (NOVOLOG FLEXPEN) 100 UNIT/ML FlexPen 10 units ac tid (Patient not taking: Reported on 04/29/2018) 15 mL 1   No current facility-administered medications for this visit.     Allergies: Patient has no known allergies.  Past Medical History:  Diagnosis Date  . Diabetes mellitus without complication ([place])   . Hypertension     Past Surgical History:  Procedure Laterality Date  . FRACTURE SURGERY    . SHOULDER SURGERY Right     Family History  Problem Relation Age of Onset  . Diabetes Father     Social History   Tobacco Use  . Smoking status: Current Some Day Smoker    Packs/day: 1.00    Years: 19.00    Pack years: 19.00    Types: Cigarettes  . Smokeless tobacco: Never Used  Substance Use  Topics  . Alcohol use: Yes    Comment: Social     Subjective:  Mr Heindl is here today requesting evaluation of acute complaint of "shaking" and back pain. He tells me that over past few week hes started to have several episodes/day of "uncontrolled shaking" which last about 30 seconds then resolve, he feels weak for several minutes after the shaking but then returns to normal. The episodes occur intermittently, seem to occur if he lays too long or moves certain ways. He says hes also noticed increase in back pain over past 2 weeks- mostly mid to lower back, right side, and both his legs "feel heavier" than normal- does have history of leg pain, neuropathy, with long history of numbness and tingling in his legs. He was actually seen in the ED for episode of shaking on 04/23/18, with unremarkable workup aside from elevated glucose, negative CT head, referred to neurology for further evaluation. He then saw neurology on 04/27/18, with no concerns for stroke, some concern for uncontrolled diabetes,and plans to do an EEG and MRI, he is awaiting these tests now. He is followed by endocrinology for diabetes, next appointment next Friday. He tells me today he wonders if his back pain is causing his shaking episodes, he really wants to find the cause, he is having trouble  going to work due to shaking, his works lifting mattresses all day, seems to notice more shaking when lifting heavier mattresses. Denies drug use, occ social drinker  Review of Systems  Constitutional: Negative for chills and fever.  Cardiovascular: Negative for chest pain.  Gastrointestinal: Negative for abdominal pain, blood in stool, diarrhea, nausea and vomiting.  Genitourinary: Negative for dysuria and hematuria.  Musculoskeletal: Positive for back pain.  Neurological: Positive for tingling and tremors. Negative for loss of consciousness.  Endo/Heme/Allergies: Does not bruise/bleed easily.  Psychiatric/Behavioral: Negative for memory  loss.   Objective:  Vitals:   04/29/18 0857  BP: 120/84  Pulse: 86  SpO2: 98%  Weight: 195 lb (88.5 kg)  Height: 6\' 3"  (1.905 m)    General: Well developed, well nourished, in no acute distress  Skin : Warm and dry.  Head: Normocephalic and atraumatic  Eyes: Sclera and conjunctiva clear; pupils round and reactive to light; extraocular movements intact  Oropharynx: Pink, supple. No suspicious lesions  Neck: Supple Lungs: Respirations unlabored; clear to auscultation bilaterally without wheeze, rales, rhonchi  CVS exam: normal rate and regular rhythm, S1 and S2 normal.  Abdomen: Soft; nontender; normoactive bowel sounds; no masses or hepatosplenomegaly  Musculoskeletal: No deformities; no active joint inflammation; normal ROM     Lumbar back: He exhibits tenderness and pain. He exhibits normal range of motion, no swelling and no deformity.  Extremities: No edema, cyanosis, clubbing  Vessels: Symmetric bilaterally  Neurologic: Alert and oriented; speech intact; face symmetrical; moves all extremities well; CNII-XII intact without focal deficit. No tremor noted Psychiatric: Normal mood and affect.  Assessment:  1. Type 2 diabetes mellitus with diabetic polyneuropathy, without long-term current use of insulin ([place])   2. Shaking   3. Chronic right-sided low back pain with bilateral sciatica     Plan:   Unsure what is causing his episodes of shaking, continue with planned neurology testing We also discussed need to work on glucose control as hyperglycemia could contribute to symptoms - advised to keep upcoming endocrinology appt and work on healthy diabetic diet at home Home management, back exercises, red flags and return precautions including when to seek immediate care discussed and printed on [facility] Imaging ordered today for further evaluation-F/U with further recommendations pending results Work note provided for him today   Return in about 1 month (around 05/28/2018) for CPE.  No  orders of the defined types were placed in this encounter.   Requested Prescriptions    No prescriptions requested or ordered in this encounter

## 2018-04-29 NOTE — Telephone Encounter (Signed)
Copied from [place] [id number]. Topic: Quick Communication - Other Results (Clinic Use ONLY) >> Apr 29, 2018  4:31 PM Cresenciano Lick, CMA wrote: Left message for pt to call back Re: recent xray results.  [facility] may inform patient of results. >> Apr 29, 2018  4:39 PM Wynetta Emery, [place] C wrote: Pt is returning call for results.

## 2018-04-29 NOTE — Telephone Encounter (Signed)
Charted in result notes. 

## 2018-05-03 ENCOUNTER — Other Ambulatory Visit (INDEPENDENT_AMBULATORY_CARE_PROVIDER_SITE_OTHER): Payer: BLUE CROSS/BLUE SHIELD

## 2018-05-03 DIAGNOSIS — E1142 Type 2 diabetes mellitus with diabetic polyneuropathy: Secondary | ICD-10-CM

## 2018-05-03 DIAGNOSIS — E782 Mixed hyperlipidemia: Secondary | ICD-10-CM | POA: Diagnosis not present

## 2018-05-03 DIAGNOSIS — Z794 Long term (current) use of insulin: Secondary | ICD-10-CM | POA: Diagnosis not present

## 2018-05-03 LAB — LIPID PANEL
Cholesterol: 308 mg/dL — ABNORMAL HIGH (ref 0–200)
HDL: 52.6 mg/dL (ref >39.00)
LDL CALC: 231 mg/dL — AB (ref 0–99)
NonHDL: 255.13
Total CHOL/HDL Ratio: 6
Triglycerides: 121 mg/dL (ref 0.0–149.0)
VLDL: 24.2 mg/dL (ref 0.0–40.0)

## 2018-05-03 LAB — COMPREHENSIVE METABOLIC PANEL
ALK PHOS: 103 U/L (ref 39–117)
ALT: 34 U/L (ref 0–53)
AST: 21 U/L (ref 0–37)
Albumin: 4.3 g/dL (ref 3.5–5.2)
BUN: 12 mg/dL (ref 6–23)
CHLORIDE: 101 meq/L (ref 96–112)
CO2: 27 mEq/L (ref 19–32)
Calcium: 9.9 mg/dL (ref 8.4–10.5)
Creatinine, Ser: 0.91 mg/dL (ref 0.40–1.50)
GFR: 108.89 mL/min (ref >60.00)
GLUCOSE: 305 mg/dL — AB (ref 70–99)
Potassium: 4.4 mEq/L (ref 3.5–5.1)
Sodium: 136 mEq/L (ref 135–145)
Total Bilirubin: 0.4 mg/dL (ref 0.2–1.2)
Total Protein: 7.8 g/dL (ref 6.0–8.3)

## 2018-05-03 LAB — HEMOGLOBIN A1C: Hgb A1c MFr Bld: 13.4 % — ABNORMAL HIGH (ref 4.6–6.5)

## 2018-05-04 ENCOUNTER — Telehealth: Payer: Self-pay | Admitting: Nurse Practitioner

## 2018-05-04 NOTE — Telephone Encounter (Signed)
Pt dropped out Short Term disability form for his back, placed in Brittanys box,   States this is for his back.   He is also requesting a MRI for his back.   Please advise

## 2018-05-04 NOTE — Telephone Encounter (Signed)
Yes I will be glad to

## 2018-05-04 NOTE — Telephone Encounter (Signed)
Ashleigh are you okay with completing Short Term Disability for pt?

## 2018-05-06 ENCOUNTER — Encounter: Payer: Self-pay | Admitting: Endocrinology

## 2018-05-06 ENCOUNTER — Ambulatory Visit: Payer: BLUE CROSS/BLUE SHIELD | Admitting: Endocrinology

## 2018-05-06 VITALS — BP 140/80 | HR 82 | Ht 75.0 in | Wt 198.8 lb

## 2018-05-06 DIAGNOSIS — E1165 Type 2 diabetes mellitus with hyperglycemia: Secondary | ICD-10-CM

## 2018-05-06 DIAGNOSIS — Z794 Long term (current) use of insulin: Secondary | ICD-10-CM | POA: Diagnosis not present

## 2018-05-06 DIAGNOSIS — E1142 Type 2 diabetes mellitus with diabetic polyneuropathy: Secondary | ICD-10-CM | POA: Diagnosis not present

## 2018-05-06 DIAGNOSIS — Z0279 Encounter for issue of other medical certificate: Secondary | ICD-10-CM

## 2018-05-06 MED ORDER — INSULIN REGULAR HUMAN 100 UNIT/ML IJ SOLN: 10.0000 [IU] | Freq: Three times a day (TID) | INTRAMUSCULAR | 1 refills | Status: DC

## 2018-05-06 MED ORDER — GABAPENTIN 100 MG PO CAPS: 100.0000 mg | ORAL_CAPSULE | Freq: Three times a day (TID) | ORAL | 0 refills | Status: DC

## 2018-05-06 NOTE — Patient Instructions (Addendum)
Take 24 units lantus daily  NOVOLIN R 6 units before breakfast and lunch and 8 at dinnertime  Check blood sugars on waking up days a week  Also check blood sugars about 2 hours after meals and do this after different meals by rotation  Recommended blood sugar levels on waking up are 90-130 and about 2 hours after meal is 130-180  Please bring your blood sugar monitor to each visit, thank you

## 2018-05-06 NOTE — Telephone Encounter (Signed)
Paperwork completed, signed and faxed to number that pt gave.

## 2018-05-06 NOTE — Progress Notes (Signed)
Patient ID: [facility], male   DOB: 06-13-1970, 48 y.o.   MRN: [id number]           Reason for Appointment: Follow-up for Type 2 Diabetes   History of Present Illness:          Date of diagnosis of type 2 diabetes mellitus: 2008       Background history:  He thinks his blood sugar at the time of diagnosis was 1000 and he was started on insulin Also at that time he was probably weighing about 260 pounds Also had been on metformin since diagnosis He was treated elsewhere and no records are available He thinks that his insulin regimen has only been with Lantus and he has never taken mealtime insulin In the last few years he has had difficulty with affording insulin and has not had any good control of his diabetes  Recent history:   INSULIN regimen is: Lantus 16 units at 10 PM , currently no NovoLog  His A1c is now 13.4, previously 11.5  Non-insulin hypoglycemic drugs the patient is taking are: Metformin 1 g twice daily  Current management, blood sugar patterns and problems identified:  On his initial consultation he was recommended starting NOVOLOG which he did not do on the last follow-up which was nearly a year ago and still has not taken this  He says it was too expensive and did not let us know about the problem with cost  He was also told to check about a brand-name glucose monitor and has not done so  Likely has only checked his sugars the last few days according to his meter and these are mostly over 200 and occasionally 300+ in the morning  Very few readings done after meals  He has been more symptomatic with his neuropathy in his feet and his now here because he is also concerned about back pain and was told to check whether this was related to his diabetes  He has not seen the diabetes educator as recommended before  Currently very inactive  Also not following any consistent meal plan even though he has seen the dietitian in 2019        Side effects  from medications have been:  Compliance with the medical regimen: Fair Hypoglycemia:   Never  Glucose monitoring:  done 1-2 times a day         Glucometer: Relion       Blood Glucose readings by review of meter:  Recent range 266-324 with only 5 readings  Self-care:The diet that the patient has been following is: None    Typical meal intake: Breakfast is usually toast, eggs and oatmeal               Dietician visit, most recent:1/19               Exercise:  Physical activity at work, none recently  Weight history: Maximum previously 260  Wt Readings from Last 3 Encounters:  05/06/18 198 lb 12.8 oz (90.2 kg)  04/29/18 195 lb (88.5 kg)  04/27/18 197 lb (89.4 kg)    Glycemic control:   Lab Results  Component Value Date   HGBA1C 13.4 (H) 05/03/2018   HGBA1C 11.5 (H) 11/18/2017   HGBA1C 11.5 07/29/2017   Lab Results  Component Value Date   MICROALBUR 1.6 11/18/2017   LDLCALC 231 (H) 05/03/2018   CREATININE 0.91 05/03/2018   Lab Results  Component Value Date   MICRALBCREAT 1.7 11/18/2017  Lab Results  Component Value Date   FRUCTOSAMINE 495 (H) 11/18/2017      Allergies as of 05/06/2018   No Known Allergies     Medication List       Accurate as of May 06, 2018  4:17 PM. Always use your most recent med list.        aspirin 81 MG chewable tablet Chew 81 mg by mouth daily.   BD LANCET ULTRAFINE 30G Misc : Check sugar twice a day   gabapentin 100 MG capsule Commonly known as:  NEURONTIN Take 1 capsule (100 mg total) by mouth 3 (three) times daily.   glucose blood test strip Check twice a day   insulin aspart 100 UNIT/ML FlexPen Commonly known as:  NOVOLOG FLEXPEN 10 units ac tid   insulin glargine 100 UNIT/ML injection Commonly known as:  LANTUS INJECT 16 UNITS UNDER THE SKIN ONCE DAILY. (NO REFILLS UNTIL PATIENT IS SEEN IN OFFICE.)   Insulin Syringes (Disposable) U-100 1 ML Misc As directed   RELION PEN NEEDLES 32G X 4 MM  Misc Generic drug:  Insulin Pen Needle USE 1  ONCE DAILY TO  INJECT  INSULIN   rosuvastatin 10 MG tablet Commonly known as:  CRESTOR Take 1 tablet (10 mg total) by mouth daily.       Allergies: No Known Allergies  Past Medical History:  Diagnosis Date  . Diabetes mellitus without complication ([place])   . Hypertension     Past Surgical History:  Procedure Laterality Date  . FRACTURE SURGERY    . SHOULDER SURGERY Right     Family History  Problem Relation Age of Onset  . Diabetes Father     Social History:  reports that he has been smoking cigarettes. He has a 19.00 pack-year smoking history. He has never used smokeless tobacco. He reports current alcohol use. He reports that he does not use drugs.   Review of Systems   Lipid history: Had been on Crestor for hyperlipidemia given by PCP but appears not to be taking this    Lab Results  Component Value Date   CHOL 308 (H) 05/03/2018   HDL 52.60 05/03/2018   LDLCALC 231 (H) 05/03/2018   LDLDIRECT 166.0 05/26/2017   TRIG 121.0 05/03/2018   CHOLHDL 6 05/03/2018           Hypertension: Not present  BP Readings from Last 3 Encounters:  05/06/18 140/80  04/29/18 120/84  04/27/18 (!) 131/93    Most recent eye exam was in 2014, has not made any follow-up  Most recent foot exam: 06/2017  Complaining of significant pain and tingling in his feet and legs separate from his back pain and not on any treatment   LABS:  Lab on 05/03/2018  Component Date Value Ref Range Status  . Cholesterol 05/03/2018 308* 0 - 200 mg/dL Final   ATP III Classification       Desirable:  < 200 mg/dL               Borderline High:  200 - 239 mg/dL          High:  > = 240 mg/dL  . Triglycerides 05/03/2018 121.0  0.0 - 149.0 mg/dL Final   Normal:  <150 mg/dLBorderline High:  150 - 199 mg/dL  . HDL 05/03/2018 52.60  >39.00 mg/dL Final  . VLDL 05/03/2018 24.2  0.0 - 40.0 mg/dL Final  . LDL Cholesterol 05/03/2018 231* 0 - 99 mg/dL Final  .  Total  CHOL/HDL Ratio 05/03/2018 6   Final                  Men          Women1/2 Average Risk     3.4          3.3Average Risk          5.0          4.42X Average Risk          9.6          7.13X Average Risk          15.0          11.0                      . NonHDL 05/03/2018 255.13   Final   NOTE:  Non-HDL goal should be 30 mg/dL higher than patient's LDL goal (i.e. LDL goal of < 70 mg/dL, would have non-HDL goal of < 100 mg/dL)  . Sodium 05/03/2018 136  135 - 145 mEq/L Final  . Potassium 05/03/2018 4.4  3.5 - 5.1 mEq/L Final  . Chloride 05/03/2018 101  96 - 112 mEq/L Final  . CO2 05/03/2018 27  19 - 32 mEq/L Final  . Glucose, Bld 05/03/2018 305* 70 - 99 mg/dL Final  . BUN 05/03/2018 12  6 - 23 mg/dL Final  . Creatinine, Ser 05/03/2018 0.91  0.40 - 1.50 mg/dL Final  . Total Bilirubin 05/03/2018 0.4  0.2 - 1.2 mg/dL Final  . Alkaline Phosphatase 05/03/2018 103  39 - 117 U/L Final  . AST 05/03/2018 21  0 - 37 U/L Final  . ALT 05/03/2018 34  0 - 53 U/L Final  . Total Protein 05/03/2018 7.8  6.0 - 8.3 g/dL Final  . Albumin 05/03/2018 4.3  3.5 - 5.2 g/dL Final  . Calcium 05/03/2018 9.9  8.4 - 10.5 mg/dL Final  . GFR 05/03/2018 108.89  >60.00 mL/min Final  . Hgb A1c MFr Bld 05/03/2018 13.4* 4.6 - 6.5 % Final   Glycemic Control Guidelines for People with Diabetes:Non Diabetic:  <6%Goal of Therapy: <7%Additional Action Suggested:  >8%     Physical Examination:  BP 140/80 (BP Location: Left Arm, Patient Position: Sitting, Cuff Size: Normal)   Pulse 82   Ht 6\' 3"  (1.905 m)   Wt 198 lb 12.8 oz (90.2 kg)   SpO2 98%   BMI 24.85 kg/m    ASSESSMENT:  Diabetes insulin-dependent, uncontrolled long-term  See history of present illness for detailed discussion of current diabetes management, blood sugar patterns and problems identified     His A1c has been consistently very high and now again over 13%  He has not been seen in follow-up for several months He appears to be significantly  insulin-dependent and now only on small doses of Lantus insulin with persistently high blood sugars which are being monitored only sporadically and probably only recently He is having significant financial difficulties affording his diabetes management but also has not been motivated to do any better  Considering his marked hyperglycemia he will benefit from more intensive basal bolus insulin regimen Also he apparently does not want to use insulin pens because of the cost, Lantus does appear to be covered by his insurance currently  He is still using a [employer] brand meter which cannot be downloaded   HYPERLIPIDEMIA: He will be following up with his PCP since he started Crestor  PERIPHERAL neuropathy: Currently on  treatment and is having significant symptoms likely from persistent hyperglycemia  PLAN:    Start Novolin R, [employer] brand with at least 6 units, taking 30 minutes before breakfast and lunch and 8 at dinnertime  After a few days he will see if his blood sugars are at least under 200 postprandially and if not at target continue to increase the dose by 2 units at a time for the respective meals  Also discussed fasting blood sugar targets of at least under 140  He will use a flowsheet which was given today for adjustment of his Lantus based on his fasting blood sugars every 3 days  Starting dose of Lantus will be 24 units instead of 16  Discussed differences between basal and bolus insulin and timing of injections  Needs consultation with  nurse educator for review of his day-to-day management for meal planning and evaluate blood sugar patterns and need for further adjustment  He will find out what brand of meter is covered by his insurance and call for prescription  Needs regular follow-up  For his peripheral neuropathy will start him empirically on gabapentin 100 mg 3 times daily and increase this as needed  Continue follow-up with PCP for back pain  There are no Patient  Instructions on file for this visit.  Counseling time on subjects discussed in assessment and plan sections is over 50% of today's 25 minute visit  Elayne Snare 05/06/2018, 4:17 PM   Note: This office note was prepared with Dragon voice recognition system technology. Any transcriptional errors that result from this process are unintentional.

## 2018-05-09 ENCOUNTER — Telehealth: Payer: Self-pay | Admitting: Nurse Practitioner

## 2018-05-09 NOTE — Telephone Encounter (Signed)
Error

## 2018-05-09 NOTE — Telephone Encounter (Signed)
Patient has dropped off additional Disability from to completed.  Attending Physician Statement.  Placed in Brittany's box.

## 2018-05-10 ENCOUNTER — Telehealth: Payer: Self-pay | Admitting: Emergency Medicine

## 2018-05-10 NOTE — Telephone Encounter (Signed)
I don't know what to order. Please advise.

## 2018-05-10 NOTE — Telephone Encounter (Signed)
Please enter, Barry Phillips is aware that pt was wanting MRI and Short term Disability length would depend on results of MRI.

## 2018-05-10 NOTE — Telephone Encounter (Signed)
Copied from [place] [id number]. Topic: General - Other >> May 09, 2018  4:45 PM Windy Kalata wrote: Reason for CRM: Pt would like to know if he can get scheduled for his MRI of the back  Call back is [phone]

## 2018-05-11 NOTE — Telephone Encounter (Signed)
Form completed. Given to provider for completion and signature.

## 2018-05-11 NOTE — Telephone Encounter (Signed)
I have not ordered an MRI of his back He needs to keep follow up MRI and EEG testing ordered by neurology, we discussed this at his recent OV

## 2018-05-11 NOTE — Telephone Encounter (Signed)
Spoke with pt to inform. Pt understood. Advised we can schedule MRI of back after he has completed these test if needed.

## 2018-05-12 NOTE — Telephone Encounter (Signed)
LVM with pt stating form was ready for pick-up at front office for completion.

## 2018-05-13 ENCOUNTER — Ambulatory Visit
Admission: RE | Admit: 2018-05-13 | Discharge: 2018-05-13 | Disposition: A | Discharge status: Home / Self Care | Payer: BLUE CROSS/BLUE SHIELD | Source: Ambulatory Visit | Attending: Neurology | Admitting: Neurology

## 2018-05-13 DIAGNOSIS — R251 Tremor, unspecified: Secondary | ICD-10-CM | POA: Diagnosis not present

## 2018-05-13 MED ORDER — GADOBENATE DIMEGLUMINE 529 MG/ML IV SOLN
18.0000 mL | Freq: Once | INTRAVENOUS | Status: AC | PRN
  Administered 2018-05-13: 18 mL via INTRAVENOUS

## 2018-05-16 ENCOUNTER — Telehealth: Payer: Self-pay

## 2018-05-16 NOTE — Progress Notes (Signed)
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 No acute findings, no explanation of trembling spells. He does have stroke risk factors. No further action is required on this test at this time, other than re-enforcing the importance of good blood pressure control, good cholesterol control, good blood sugar control, and weight management.  Please remind patient to keep any upcoming appointments or tests, EEG scheduled for this month.  Thanks,  Star Age, MD, PhD

## 2018-05-16 NOTE — Telephone Encounter (Signed)
I called pt to discuss his MRI results. No answer, left a message asking him to call me back. 

## 2018-05-16 NOTE — Telephone Encounter (Signed)
Phone staff informed me that pt was returning my call but he hung up before they could transfer him to me.  I called pt again to discuss. No answer, left a message asking him to call me back.

## 2018-05-16 NOTE — Telephone Encounter (Signed)
Pt returned my call. I advised him of his MRI results. Pt will proceed with the EEG and understands that we will call him with those results. Pt verbalized understanding of results. Pt had no questions at this time but was encouraged to call back if questions arise.

## 2018-05-16 NOTE — Telephone Encounter (Signed)
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 No acute findings, no explanation of trembling spells. He does have stroke risk factors. No further action is required on this test at this time, other than re-enforcing the importance of good blood pressure control, good cholesterol control, good blood sugar control, and weight management.  Please remind patient to keep any upcoming appointments or tests, EEG scheduled for this month.  Thanks,  Star Age, MD, PhD

## 2018-05-18 ENCOUNTER — Ambulatory Visit: Payer: BLUE CROSS/BLUE SHIELD | Admitting: Nutrition

## 2018-05-24 ENCOUNTER — Ambulatory Visit: Payer: BLUE CROSS/BLUE SHIELD | Admitting: Nutrition

## 2018-05-31 NOTE — Telephone Encounter (Signed)
Forms have been charged for. 2/19 bo

## 2018-06-06 ENCOUNTER — Other Ambulatory Visit: Payer: BLUE CROSS/BLUE SHIELD

## 2018-06-13 ENCOUNTER — Encounter: Payer: Self-pay | Admitting: Nurse Practitioner

## 2018-06-16 ENCOUNTER — Other Ambulatory Visit (INDEPENDENT_AMBULATORY_CARE_PROVIDER_SITE_OTHER): Payer: BLUE CROSS/BLUE SHIELD

## 2018-06-16 ENCOUNTER — Other Ambulatory Visit: Payer: Self-pay

## 2018-06-16 DIAGNOSIS — E1165 Type 2 diabetes mellitus with hyperglycemia: Secondary | ICD-10-CM | POA: Diagnosis not present

## 2018-06-16 DIAGNOSIS — Z794 Long term (current) use of insulin: Secondary | ICD-10-CM

## 2018-06-16 LAB — COMPREHENSIVE METABOLIC PANEL
ALT: 21 U/L (ref 0–53)
AST: 18 U/L (ref 0–37)
Albumin: 4 g/dL (ref 3.5–5.2)
Alkaline Phosphatase: 93 U/L (ref 39–117)
BUN: 12 mg/dL (ref 6–23)
CO2: 28 mEq/L (ref 19–32)
Calcium: 9.5 mg/dL (ref 8.4–10.5)
Chloride: 103 mEq/L (ref 96–112)
Creatinine, Ser: 0.94 mg/dL (ref 0.40–1.50)
GFR: 104.84 mL/min (ref >60.00)
Glucose, Bld: 227 mg/dL — ABNORMAL HIGH (ref 70–99)
Potassium: 4.3 mEq/L (ref 3.5–5.1)
Sodium: 138 mEq/L (ref 135–145)
Total Bilirubin: 0.3 mg/dL (ref 0.2–1.2)
Total Protein: 7.3 g/dL (ref 6.0–8.3)

## 2018-06-17 LAB — FRUCTOSAMINE: Fructosamine: 554 umol/L — ABNORMAL HIGH (ref 0–285)

## 2018-06-21 ENCOUNTER — Other Ambulatory Visit: Payer: Self-pay

## 2018-06-21 ENCOUNTER — Encounter: Payer: Self-pay | Admitting: Endocrinology

## 2018-06-21 ENCOUNTER — Ambulatory Visit: Payer: BLUE CROSS/BLUE SHIELD | Admitting: Endocrinology

## 2018-06-21 ENCOUNTER — Ambulatory Visit (INDEPENDENT_AMBULATORY_CARE_PROVIDER_SITE_OTHER): Payer: BLUE CROSS/BLUE SHIELD | Admitting: Endocrinology

## 2018-06-21 DIAGNOSIS — E1165 Type 2 diabetes mellitus with hyperglycemia: Secondary | ICD-10-CM

## 2018-06-21 DIAGNOSIS — Z794 Long term (current) use of insulin: Secondary | ICD-10-CM | POA: Diagnosis not present

## 2018-06-21 NOTE — Progress Notes (Signed)
24 Patient ID: [facility], male   DOB: 05-Jun-1970, 48 y.o.   MRN: [id number]           Reason for Appointment: Follow-up for Type 2 Diabetes   Today's office visit was provided via telemedicine using an audio phone call . Consent for the patient has been obtained . Location of the patient: Home . Location of the provider: Office Only the patient and myself were participating in the encounter   History of Present Illness:          Date of diagnosis of type 2 diabetes mellitus: 2008       Background history:  He thinks his blood sugar at the time of diagnosis was 1000 and he was started on insulin Also at that time he was probably weighing about 260 pounds Also had been on metformin since diagnosis He was treated elsewhere and no records are available He thinks that his insulin regimen has only been with Lantus and he has never taken mealtime insulin In the last few years he has had difficulty with affording insulin and has not had any good control of his diabetes  Recent history:   INSULIN regimen is: Lantus 24 units at 10 PM, Novolin R 12 units twice daily  His A1c is last 13.4, previously 11.5 and fructosamine is 554  Non-insulin hypoglycemic drugs the patient is taking are: Metformin 1 g twice daily  Current management, blood sugar patterns and problems identified:  He did increase his Lantus on his last visit but his fasting readings are still consistently high  However he may have missed a day or 2 periodically and also he thinks that if he is not eating much in the evening he will not take it  He was also started on Novolin regular insulin before meals and is taking 12 units, has also increase the dose from his starting dose of 6 units  However blood sugars at bedtime are at least 190 and as high as 332  He is eating a large breakfast and no lunch and his blood sugar after breakfast in the lab was 227, not checked at home  He is doing a little walking but  limited activity because of back pain  Only able to afford generic medications and he is not sure if he would be able to afford the Lantus again        Side effects from medications have been: None  Compliance with the medical regimen: Fair  Glucose monitoring:  done 1-2 times a day         Glucometer: Relion       Blood Glucose readings by review of meter:   PRE-MEAL Fasting Lunch Dinner Bedtime Overall  Glucose range:  179-298 ?   202, 212  194-332   Mean/median:          Self-care:The diet that the patient has been following is: None    Typical meal intake: Breakfast is usually toast, eggs and oatmeal               Dietician visit, most recent:1/19               Exercise:  Some walking, stretching  Weight history: Maximum previously 260  Wt Readings from Last 3 Encounters:  05/06/18 198 lb 12.8 oz (90.2 kg)  04/29/18 195 lb (88.5 kg)  04/27/18 197 lb (89.4 kg)    Glycemic control:   Lab Results  Component Value Date   HGBA1C 13.4 (  H) 05/03/2018   HGBA1C 11.5 (H) 11/18/2017   HGBA1C 11.5 07/29/2017   Lab Results  Component Value Date   MICROALBUR 1.6 11/18/2017   LDLCALC 231 (H) 05/03/2018   CREATININE 0.94 06/16/2018   Lab Results  Component Value Date   MICRALBCREAT 1.7 11/18/2017    Lab Results  Component Value Date   FRUCTOSAMINE 554 (H) 06/16/2018   FRUCTOSAMINE 495 (H) 11/18/2017      Allergies as of 06/21/2018   No Known Allergies     Medication List       Accurate as of June 21, 2018  1:35 PM. Always use your most recent med list.        aspirin 81 MG chewable tablet Chew 81 mg by mouth daily.   BD Lancet Ultrafine 30G Misc : Check sugar twice a day   gabapentin 100 MG capsule Commonly known as:  NEURONTIN Take 1 capsule (100 mg total) by mouth 3 (three) times daily.   glucose blood test strip Check twice a day   insulin glargine 100 UNIT/ML injection Commonly known as:  LANTUS INJECT 16 UNITS UNDER THE SKIN ONCE DAILY.  (NO REFILLS UNTIL PATIENT IS SEEN IN OFFICE.)   insulin regular 100 units/mL injection Commonly known as:  NovoLIN R ReliOn Inject 0.1 mLs (10 Units total) into the skin 3 (three) times daily before meals.   Insulin Syringes (Disposable) U-100 1 ML Misc As directed   ReliOn Pen Needles 32G X 4 MM Misc Generic drug:  Insulin Pen Needle USE 1  ONCE DAILY TO  INJECT  INSULIN   rosuvastatin 10 MG tablet Commonly known as:  Crestor Take 1 tablet (10 mg total) by mouth daily.       Allergies: No Known Allergies  Past Medical History:  Diagnosis Date  . Diabetes mellitus without complication ([place])   . Hypertension     Past Surgical History:  Procedure Laterality Date  . FRACTURE SURGERY    . SHOULDER SURGERY Right     Family History  Problem Relation Age of Onset  . Diabetes Father     Social History:  reports that he has been smoking cigarettes. He has a 19.00 pack-year smoking history. He has never used smokeless tobacco. He reports current alcohol use. He reports that he does not use drugs.   Review of Systems   Lipid history: Had been on Crestor for hyperlipidemia given by PCP but he has not picked up the prescription    Lab Results  Component Value Date   CHOL 308 (H) 05/03/2018   HDL 52.60 05/03/2018   LDLCALC 231 (H) 05/03/2018   LDLDIRECT 166.0 05/26/2017   TRIG 121.0 05/03/2018   CHOLHDL 6 05/03/2018           Hypertension: Not on any treatment  BP Readings from Last 3 Encounters:  05/06/18 140/80  04/29/18 120/84  04/27/18 (!) 131/93    Most recent eye exam was in 2014, has not had follow-up  Most recent foot exam: 06/2017  Gabapentin was started for his neuropathic pains in his feet  Has had chronic back problems   LABS:  Lab on 06/16/2018  Component Date Value Ref Range Status  . Fructosamine 06/16/2018 554* 0 - 285 umol/L Final   Comment: Published reference interval for apparently healthy subjects between age 32 and 77 is 45 -  285 umol/L and in a poorly controlled diabetic population is 228 - 563 umol/L with a mean of 396 umol/L.   Marland Kitchen  Sodium 06/16/2018 138  135 - 145 mEq/L Final  . Potassium 06/16/2018 4.3  3.5 - 5.1 mEq/L Final  . Chloride 06/16/2018 103  96 - 112 mEq/L Final  . CO2 06/16/2018 28  19 - 32 mEq/L Final  . Glucose, Bld 06/16/2018 227* 70 - 99 mg/dL Final  . BUN 06/16/2018 12  6 - 23 mg/dL Final  . Creatinine, Ser 06/16/2018 0.94  0.40 - 1.50 mg/dL Final  . Total Bilirubin 06/16/2018 0.3  0.2 - 1.2 mg/dL Final  . Alkaline Phosphatase 06/16/2018 93  39 - 117 U/L Final  . AST 06/16/2018 18  0 - 37 U/L Final  . ALT 06/16/2018 21  0 - 53 U/L Final  . Total Protein 06/16/2018 7.3  6.0 - 8.3 g/dL Final  . Albumin 06/16/2018 4.0  3.5 - 5.2 g/dL Final  . Calcium 06/16/2018 9.5  8.4 - 10.5 mg/dL Final  . GFR 06/16/2018 104.84  >60.00 mL/min Final    Physical Examination:  There were no vitals taken for this visit.   ASSESSMENT:  Diabetes insulin-dependent  See history of present illness for detailed discussion of current diabetes management, blood sugar patterns and problems identified     His A1c has been consistently very high and last 13% Fructosamine is 554 equivalent to an A1c of about 11  Even with increasing his insulin his blood sugars are still not adequately controlled. Does not appear to have taken his Lantus consistently every day and his blood sugars the last 2 mornings have been higher than usual Most likely also needs higher doses of regular insulin at least at suppertime He is a good candidate for Invokana or Jardiance but unable to afford brand-name medications   HYPERLIPIDEMIA: Reminded him to pick up his prescription which was prescribed about 3 weeks ago by his PCP   PLAN:    He will increase Lantus by at least 4 units to 28 units and may take even 30 or 32 if morning sugars are still over 140  Follow-up in 2 months  He will go to 16 units on the regular insulin   May consider adding Jardiance or Invokana on the next visit with a co-pay card  Discussed needing to check readings couple of hours after eating rather than just before breakfast and bedtime  There are no Patient Instructions on file for this visit.   Total phone visit time =9 minutes  Elayne Snare 06/21/2018, 1:35 PM   Note: This office note was prepared with Dragon voice recognition system technology. Any transcriptional errors that result from this process are unintentional.

## 2018-07-10 ENCOUNTER — Other Ambulatory Visit: Payer: Self-pay | Admitting: Endocrinology

## 2018-07-10 DIAGNOSIS — Z794 Long term (current) use of insulin: Principal | ICD-10-CM

## 2018-07-10 DIAGNOSIS — E1142 Type 2 diabetes mellitus with diabetic polyneuropathy: Secondary | ICD-10-CM

## 2018-07-13 ENCOUNTER — Other Ambulatory Visit: Payer: Self-pay

## 2018-07-13 ENCOUNTER — Other Ambulatory Visit: Payer: Self-pay | Admitting: Family

## 2018-07-13 DIAGNOSIS — Z794 Long term (current) use of insulin: Principal | ICD-10-CM

## 2018-07-13 DIAGNOSIS — E1142 Type 2 diabetes mellitus with diabetic polyneuropathy: Secondary | ICD-10-CM

## 2018-07-13 MED ORDER — GABAPENTIN 100 MG PO CAPS: 100.0000 mg | ORAL_CAPSULE | Freq: Three times a day (TID) | ORAL | 0 refills | Status: DC

## 2018-07-13 MED ORDER — INSULIN GLARGINE 100 UNIT/ML ~~LOC~~ SOLN: SUBCUTANEOUS | 0 refills | Status: DC

## 2018-07-21 ENCOUNTER — Encounter: Payer: Self-pay | Admitting: Internal Medicine

## 2018-07-21 ENCOUNTER — Other Ambulatory Visit: Payer: Self-pay

## 2018-07-21 ENCOUNTER — Other Ambulatory Visit (INDEPENDENT_AMBULATORY_CARE_PROVIDER_SITE_OTHER): Payer: BLUE CROSS/BLUE SHIELD

## 2018-07-21 ENCOUNTER — Ambulatory Visit (INDEPENDENT_AMBULATORY_CARE_PROVIDER_SITE_OTHER): Payer: BLUE CROSS/BLUE SHIELD | Admitting: Internal Medicine

## 2018-07-21 VITALS — BP 136/88 | HR 87 | Temp 98.7°F | Ht 75.0 in | Wt 206.0 lb

## 2018-07-21 DIAGNOSIS — Z0001 Encounter for general adult medical examination with abnormal findings: Secondary | ICD-10-CM

## 2018-07-21 DIAGNOSIS — Z23 Encounter for immunization: Secondary | ICD-10-CM | POA: Diagnosis not present

## 2018-07-21 DIAGNOSIS — M5416 Radiculopathy, lumbar region: Secondary | ICD-10-CM | POA: Diagnosis not present

## 2018-07-21 DIAGNOSIS — Z794 Long term (current) use of insulin: Secondary | ICD-10-CM | POA: Diagnosis not present

## 2018-07-21 DIAGNOSIS — E1142 Type 2 diabetes mellitus with diabetic polyneuropathy: Secondary | ICD-10-CM

## 2018-07-21 DIAGNOSIS — M47816 Spondylosis without myelopathy or radiculopathy, lumbar region: Secondary | ICD-10-CM | POA: Insufficient documentation

## 2018-07-21 DIAGNOSIS — I6381 Other cerebral infarction due to occlusion or stenosis of small artery: Secondary | ICD-10-CM | POA: Insufficient documentation

## 2018-07-21 HISTORY — DX: Other cerebral infarction due to occlusion or stenosis of small artery: I63.81

## 2018-07-21 LAB — URINALYSIS, ROUTINE W REFLEX MICROSCOPIC
Bilirubin Urine: NEGATIVE
Hgb urine dipstick: NEGATIVE
Ketones, ur: NEGATIVE
Leukocytes,Ua: NEGATIVE
Nitrite: NEGATIVE
RBC / HPF: NONE SEEN (ref 0–?)
Specific Gravity, Urine: 1.02 (ref 1.000–1.030)
Total Protein, Urine: NEGATIVE
Urine Glucose: >=1000 — AB
Urobilinogen, UA: 0.2 (ref 0.0–1.0)
pH: 5.5 (ref 5.0–8.0)

## 2018-07-21 LAB — BASIC METABOLIC PANEL
BUN: 13 mg/dL (ref 6–23)
CO2: 28 mEq/L (ref 19–32)
Calcium: 9.5 mg/dL (ref 8.4–10.5)
Chloride: 99 mEq/L (ref 96–112)
Creatinine, Ser: 0.94 mg/dL (ref 0.40–1.50)
GFR: 104.79 mL/min (ref >60.00)
Glucose, Bld: 413 mg/dL — ABNORMAL HIGH (ref 70–99)
Potassium: 5.1 mEq/L (ref 3.5–5.1)
Sodium: 134 mEq/L — ABNORMAL LOW (ref 135–145)

## 2018-07-21 LAB — HEPATIC FUNCTION PANEL
ALT: 15 U/L (ref 0–53)
AST: 13 U/L (ref 0–37)
Albumin: 4.2 g/dL (ref 3.5–5.2)
Alkaline Phosphatase: 115 U/L (ref 39–117)
Bilirubin, Direct: 0.1 mg/dL (ref 0.0–0.3)
Total Bilirubin: 0.4 mg/dL (ref 0.2–1.2)
Total Protein: 7.1 g/dL (ref 6.0–8.3)

## 2018-07-21 LAB — LIPID PANEL
Cholesterol: 321 mg/dL — ABNORMAL HIGH (ref 0–200)
HDL: 44.1 mg/dL (ref >39.00)
LDL Cholesterol: 238 mg/dL — ABNORMAL HIGH (ref 0–99)
NonHDL: 276.9
Total CHOL/HDL Ratio: 7
Triglycerides: 195 mg/dL — ABNORMAL HIGH (ref 0.0–149.0)
VLDL: 39 mg/dL (ref 0.0–40.0)

## 2018-07-21 LAB — CBC WITH DIFFERENTIAL/PLATELET
Basophils Absolute: 0.1 10*3/uL (ref 0.0–0.1)
Basophils Relative: 0.6 % (ref 0.0–3.0)
Eosinophils Absolute: 0.2 10*3/uL (ref 0.0–0.7)
Eosinophils Relative: 2.5 % (ref 0.0–5.0)
HCT: 44.8 % (ref 39.0–52.0)
Hemoglobin: 15.1 g/dL (ref 13.0–17.0)
Lymphocytes Relative: 23.9 % (ref 12.0–46.0)
Lymphs Abs: 2 10*3/uL (ref 0.7–4.0)
MCHC: 33.7 g/dL (ref 30.0–36.0)
MCV: 87.9 fl (ref 78.0–100.0)
Monocytes Absolute: 0.4 10*3/uL (ref 0.1–1.0)
Monocytes Relative: 4.8 % (ref 3.0–12.0)
Neutro Abs: 5.8 10*3/uL (ref 1.4–7.7)
Neutrophils Relative %: 68.2 % (ref 43.0–77.0)
Platelets: 215 10*3/uL (ref 150.0–400.0)
RBC: 5.1 Mil/uL (ref 4.22–5.81)
RDW: 13.6 % (ref 11.5–15.5)
WBC: 8.4 10*3/uL (ref 4.0–10.5)

## 2018-07-21 LAB — MICROALBUMIN / CREATININE URINE RATIO
Creatinine,U: 38.9 mg/dL
Microalb Creat Ratio: 1.8 mg/g (ref 0.0–30.0)
Microalb, Ur: <0.7 mg/dL (ref 0.0–1.9)

## 2018-07-21 LAB — HEMOGLOBIN A1C: Hgb A1c MFr Bld: 13.9 % — ABNORMAL HIGH (ref 4.6–6.5)

## 2018-07-21 MED ORDER — INSULIN GLARGINE 100 UNIT/ML ~~LOC~~ SOLN: SUBCUTANEOUS | 0 refills | Status: DC

## 2018-07-21 NOTE — Progress Notes (Signed)
Subjective:    Patient ID: [facility], male    DOB: 09/05/1970, 48 y.o.   MRN: [id number]  HPI  Here for wellness and f/u;  Overall doing ok;  Pt denies Chest pain, worsening SOB, DOE, wheezing, orthopnea, PND, worsening LE edema, palpitations, dizziness or syncope.  Pt denies neurological change such as new headache, facial or extremity weakness.  Pt denies polydipsia, polyuria, or low sugar symptoms. Pt states overall good compliance with treatment and medications, good tolerability, and has been trying to follow appropriate diet.  Pt denies worsening depressive symptoms, suicidal ideation or panic. No fever, night sweats, wt loss, loss of appetite, or other constitutional symptoms.  Pt states good ability with ADL's, has low fall risk, home safety reviewed and adequate, no other significant changes in hearing or vision, and only occasionally active with exercise.  Here also with Feb 7 and Feb 8 ED visits for low back pain, mod, intermittent, "tension" like and sometimes sharp and actually gets shaky from the pain. Also with left > right leg pain   Pt denies bowel or bladder change, fever, wt loss,  worsening LE numbness/weakness more than known peripheral neuropathy or falls.   Not working due to back pain x 3 mo, also furloughed from work due to [employer]. Not able to apply for STD, says doctor didn't sign off on it and it was turned down.    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  Unable to afford lantus while not working.  Neuropathy pills seem to help. Asking for MRI and eventually a note to say ok for back to work without restrictions. Past Medical History:  Diagnosis Date  . Diabetes mellitus without complication ([place])   . Hypertension   . Lacunar stroke ([place]) 07/21/2018   Per feb 2020 MRI   Past Surgical History:  Procedure Laterality Date  . FRACTURE SURGERY    . SHOULDER SURGERY Right     reports that he has been smoking cigarettes. He has a 19.00 pack-year  smoking history. He has never used smokeless tobacco. He reports current alcohol use. He reports that he does not use drugs. family history includes Diabetes in his father. No Known Allergies Current Outpatient Medications on File Prior to Visit  Medication Sig Dispense Refill  . aspirin 81 MG chewable tablet Chew 81 mg by mouth daily.    Marland Kitchen gabapentin (NEURONTIN) 100 MG capsule Take 1 capsule (100 mg total) by mouth 3 (three) times daily. 90 capsule 0  . glucose blood test strip Check twice a day 100 each 12  . insulin regular (NOVOLIN R RELION) 100 units/mL injection Inject 0.1 mLs (10 Units total) into the skin 3 (three) times daily before meals. (Patient taking differently: Inject 14 Units into the skin 3 (three) times daily before meals. ) 10 mL 1  . Insulin Syringes, Disposable, U-100 1 ML MISC As directed 100 each 0  . Lancets (BD LANCET ULTRAFINE 30G) MISC : Check sugar twice a day 100 each 3  . RELION PEN NEEDLES 32G X 4 MM MISC USE 1  ONCE DAILY TO  INJECT  INSULIN 100 each 0   No current facility-administered medications on file prior to visit.    Review of Systems Constitutional: Negative for other unusual diaphoresis, sweats, appetite or weight changes HENT: Negative for other worsening hearing loss, ear pain, facial swelling, mouth sores or neck stiffness.   Eyes: Negative for other worsening pain, redness or other visual disturbance.  Respiratory:  Negative for other stridor or swelling Cardiovascular: Negative for other palpitations or other chest pain  Gastrointestinal: Negative for worsening diarrhea or loose stools, blood in stool, distention or other pain Genitourinary: Negative for hematuria, flank pain or other change in urine volume.  Musculoskeletal: Negative for myalgias or other joint swelling.  Skin: Negative for other color change, or other wound or worsening drainage.  Neurological: Negative for other syncope or numbness. Hematological: Negative for other  adenopathy or swelling Psychiatric/Behavioral: Negative for hallucinations, other worsening agitation, SI, self-injury, or new decreased concentration\All other system neg per pt    Objective:   Physical Exam BP 136/88   Pulse 87   Temp 98.7 F (37.1 C) (Oral)   Ht 6\' 3"  (1.905 m)   Wt 206 lb (93.4 kg)   SpO2 98%   BMI 25.75 kg/m  VS noted,  Constitutional: Pt is oriented to person, place, and time. Appears well-developed and well-nourished, in no significant distress and comfortable Head: Normocephalic and atraumatic  Eyes: Conjunctivae and EOM are normal. Pupils are equal, round, and reactive to light Right Ear: External ear normal without discharge Left Ear: External ear normal without discharge Nose: Nose without discharge or deformity Mouth/Throat: Oropharynx is without other ulcerations and moist  Neck: Normal range of motion. Neck supple. No JVD present. No tracheal deviation present or significant neck LA or mass Cardiovascular: Normal rate, regular rhythm, normal heart sounds and intact distal pulses.   Pulmonary/Chest: WOB normal and breath sounds without rales or wheezing  Abdominal: Soft. Bowel sounds are normal. NT. No HSM  Musculoskeletal: Normal range of motion. Exhibits no edema Lymphadenopathy: Has no other cervical adenopathy.  Neurological: Pt is alert and oriented to person, place, and time. Pt has normal reflexes. No cranial nerve deficit. Motor grossly intact except for trace to 1+ LLE weakness (left handed) Skin: Skin is warm and dry. No rash noted or new ulcerations Psychiatric:  Has normal mood and affect. Behavior is normal without agitation No other exam findings Lab Results  Component Value Date   WBC 8.4 07/21/2018   HGB 15.1 07/21/2018   HCT 44.8 07/21/2018   PLT 215.0 07/21/2018   GLUCOSE 413 (H) 07/21/2018   CHOL 321 (H) 07/21/2018   TRIG 195.0 (H) 07/21/2018   HDL 44.10 07/21/2018   LDLDIRECT 166.0 05/26/2017   LDLCALC 238 (H) 07/21/2018    ALT 15 07/21/2018   AST 13 07/21/2018   NA 134 (L) 07/21/2018   K 5.1 07/21/2018   CL 99 07/21/2018   CREATININE 0.94 07/21/2018   BUN 13 07/21/2018   CO2 28 07/21/2018   TSH 0.95 07/21/2018   PSA 0.30 07/21/2018   HGBA1C 13.9 (H) 07/21/2018   MICROALBUR <0.7 07/21/2018      Assessment & Plan:

## 2018-07-21 NOTE — Patient Instructions (Addendum)
You had the Tdap tetanus shot today  Ok to hold on the lantus that is not affordable now  Please start OTC Relion ([employer] brand) 70/30 insulin at 20 units in the AM, and 10 units in the PM  Please for now decrease the Novolin R OTC insulin to 7 units, but ok to increase again if sugar remains > 150 and no low sugars  Please continue all other medications as before, and refills have been done if requested.  Please have the pharmacy call with any other refills you may need.  Please continue your efforts at being more active, low cholesterol diet, and weight control.  You are otherwise up to date with prevention measures today.  Please keep your appointments with your specialists as you may have planned- Dr Dwyane Dee next month  You will be contacted regarding the referral for: MRI LS Spine  You will be contacted regarding the referral for: Eye doctor, foot doctor  Please go to the LAB in the Basement (turn left off the elevator) for the tests to be done today  You will be contacted by phone if any changes need to be made immediately.  Otherwise, you will receive a letter about your results with an explanation, but please check with MyChart first.  Please remember to sign up for MyChart if you have not done so, as this will be important to you in the future with finding out test results, communicating by private email, and scheduling acute appointments online when needed.  Please return in 6 months, or sooner if needed

## 2018-07-22 ENCOUNTER — Telehealth: Payer: Self-pay

## 2018-07-22 LAB — PSA: PSA: 0.3 ng/mL (ref 0.10–4.00)

## 2018-07-22 LAB — TSH: TSH: 0.95 u[IU]/mL (ref 0.35–4.50)

## 2018-07-22 NOTE — Telephone Encounter (Signed)
-----   Message from Biagio Borg, MD sent at 07/21/2018  5:16 PM EDT ----- Regarding: work note I have to hold on that until we have the MRI results to see if this is safe; the type of work he does will possibly make his left lumbar radiculopathy worse.  I would very much like to do that letter, but it may not be safe. Thanks ----- Message ----- From: Juliet Rude, CMA Sent: 07/21/2018   3:01 PM EDT To: Biagio Borg, MD  Pt asked if he could have a note to return to work. Stated that he has been out of work too long. Please advise.

## 2018-07-22 NOTE — Telephone Encounter (Signed)
Pt has been informed via his MyChart. He sent a request after OV regarding the note.

## 2018-07-23 ENCOUNTER — Other Ambulatory Visit: Payer: Self-pay | Admitting: Internal Medicine

## 2018-07-23 ENCOUNTER — Encounter: Payer: Self-pay | Admitting: Internal Medicine

## 2018-07-23 MED ORDER — ROSUVASTATIN CALCIUM 40 MG PO TABS: 40.0000 mg | ORAL_TABLET | Freq: Every day | ORAL | 3 refills | Status: DC

## 2018-07-23 NOTE — Assessment & Plan Note (Addendum)
Pt unable to afford current insuin, ok to start otc 70/30 AT 20 units in AM, 10 U in PM, continue regular otc insulin at half dose to start and titrate up for sugars > 200; plans to f/u with Endo next month for further consideration

## 2018-07-23 NOTE — Assessment & Plan Note (Signed)

## 2018-07-23 NOTE — Assessment & Plan Note (Addendum)
Recent onset moderate, for LS SPine MRI, consider ortho referral  In addition to the time spent performing CPE, I spent an additional 25 minutes face to face,in which greater than 50% of this time was spent in counseling and coordination of care for patient's acute illness as documented, including the differential dx, treatment, further evaluation and other management of left lumbar radiculopathy, and uncontrolled DM

## 2018-07-26 ENCOUNTER — Telehealth: Payer: Self-pay

## 2018-07-26 ENCOUNTER — Telehealth: Payer: Self-pay | Admitting: Internal Medicine

## 2018-07-26 NOTE — Telephone Encounter (Signed)
Copied from [place] [id number]. Topic: Quick Communication - See Telephone Encounter >> Jul 26, 2018  1:03 PM Robina Ade, Helene Kelp D wrote: CRM for notification. See Telephone encounter for: 07/26/18. Patient is calling to check the status of his referral for his back issues, please call patient back, thanks.

## 2018-07-26 NOTE — Telephone Encounter (Signed)
-----   Message from Biagio Borg, MD sent at 07/23/2018 12:15 PM EDT ----- Left message on MyChart, pt to cont same tx except  The test results show that your current treatment is OK, except the A1c is severely high.  Please start the insulin as we discussed, and make sure to follow up with Dr Dwyane Dee as planned..  Also the LDL cholesterol is severely high as well, so we need to increase the crestor to help reduce your risk of heart disease and stroke  Mima Cranmore to please inform pt, I will do rx for the statin

## 2018-07-26 NOTE — Telephone Encounter (Signed)
Called pt, LVM.    Discuss lab results. Ok for [facility] to inform.

## 2018-07-26 NOTE — Telephone Encounter (Signed)
Left detailed msg for him to call [facility] at [phone] or he can call me at [phone]

## 2018-08-03 ENCOUNTER — Ambulatory Visit
Admission: RE | Admit: 2018-08-03 | Discharge: 2018-08-03 | Disposition: A | Discharge status: Home / Self Care | Payer: BLUE CROSS/BLUE SHIELD | Source: Ambulatory Visit | Attending: Internal Medicine | Admitting: Internal Medicine

## 2018-08-03 ENCOUNTER — Other Ambulatory Visit: Payer: Self-pay

## 2018-08-03 DIAGNOSIS — M5416 Radiculopathy, lumbar region: Secondary | ICD-10-CM

## 2018-08-03 DIAGNOSIS — M545 Low back pain: Secondary | ICD-10-CM | POA: Diagnosis not present

## 2018-08-04 ENCOUNTER — Other Ambulatory Visit: Payer: Self-pay | Admitting: Internal Medicine

## 2018-08-04 ENCOUNTER — Encounter: Payer: Self-pay | Admitting: Internal Medicine

## 2018-08-04 ENCOUNTER — Telehealth: Payer: Self-pay

## 2018-08-04 DIAGNOSIS — M5416 Radiculopathy, lumbar region: Secondary | ICD-10-CM

## 2018-08-04 NOTE — Telephone Encounter (Signed)
-----   Message from Biagio Borg, MD sent at 08/04/2018 10:30 AM EDT ----- Left message on MyChart, pt to cont same tx except  The test results show that your current treatment is OK, except the MRI does show areas that could be involving pinching of the nerves  We should refer you to [facility]urg[facility] for further consideration.  I will do this, and you should hopefully hear from [place] soon.Redmond Baseman to please inform pt, I will do referral

## 2018-08-04 NOTE — Telephone Encounter (Signed)
Pt has viewed results via MyChart  

## 2018-08-04 NOTE — Telephone Encounter (Signed)
Shirron, can you get with pt to determine the off work dates and note for this patient, to go back next Tuesday

## 2018-08-05 ENCOUNTER — Encounter: Payer: Self-pay | Admitting: Internal Medicine

## 2018-08-15 ENCOUNTER — Ambulatory Visit: Payer: BLUE CROSS/BLUE SHIELD | Admitting: Podiatry

## 2018-08-15 ENCOUNTER — Other Ambulatory Visit: Payer: Self-pay

## 2018-08-22 ENCOUNTER — Ambulatory Visit: Payer: BLUE CROSS/BLUE SHIELD | Admitting: Endocrinology

## 2018-09-09 ENCOUNTER — Ambulatory Visit: Payer: Self-pay | Admitting: Podiatry

## 2018-10-19 IMAGING — CT CT RENAL STONE PROTOCOL
2 of 4 series · 16 of 46 positions shown, 18 images · non-contrast
Comparison: None.

CLINICAL DATA: 45-year-old male with acute right flank and
abdominal pain for 1 day.

EXAM:
CT ABDOMEN AND PELVIS WITHOUT CONTRAST
TECHNIQUE: Multidetector CT imaging of the abdomen and pelvis was performed
following the standard protocol without IV contrast.

[Series 3: renal stone 5.0 · axial · 0.83mm/px · z∈[+747,+1297]mm · 13 of 121 slices shown, 15 images]
[im 6/121  soft-tissue]
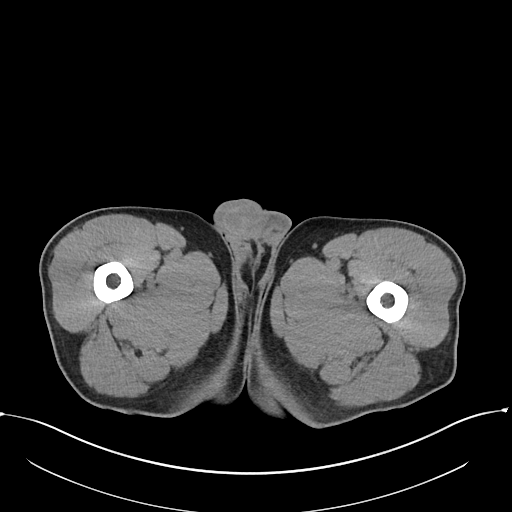
[im 6/121  bone]
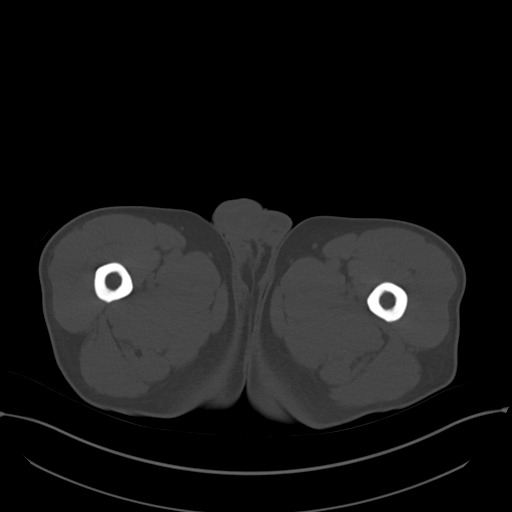
[im 16/121  soft-tissue]
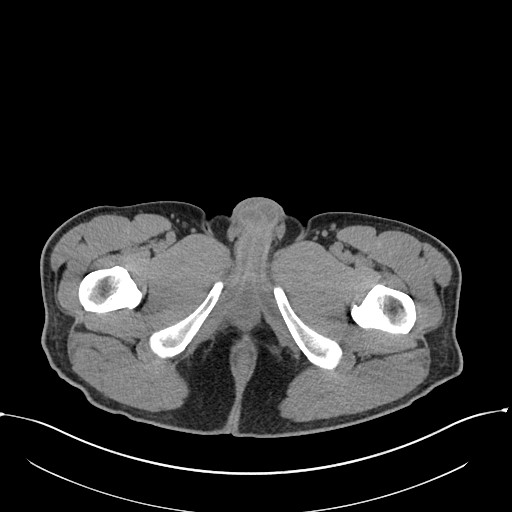
[im 26/121  soft-tissue]
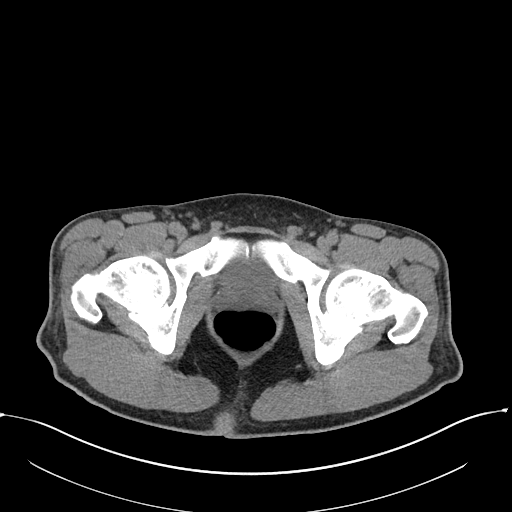
[im 36/121  soft-tissue]
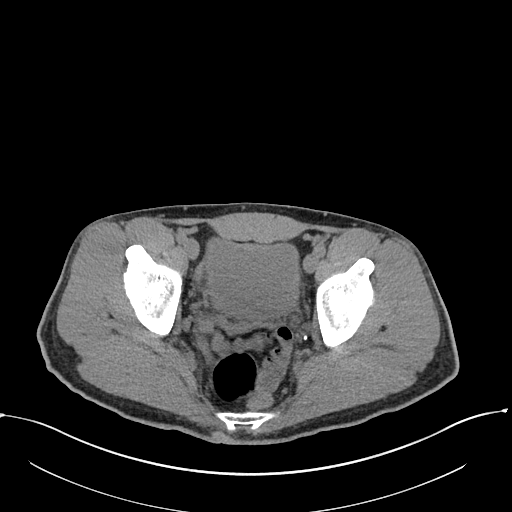
[im 41/121  soft-tissue]
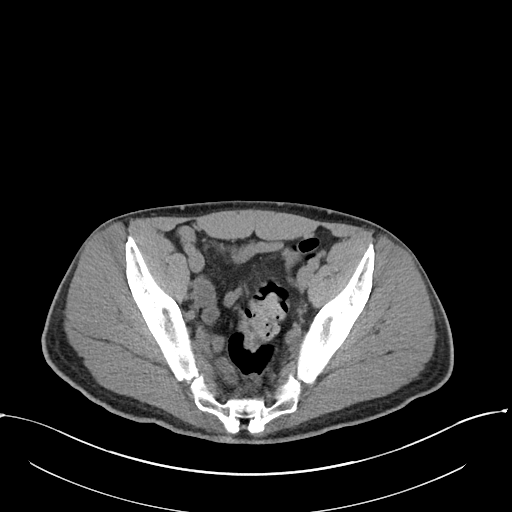
[im 51/121  soft-tissue]
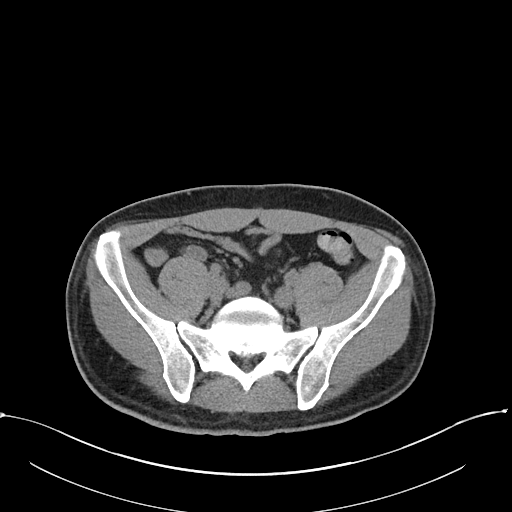
[im 61/121  soft-tissue]
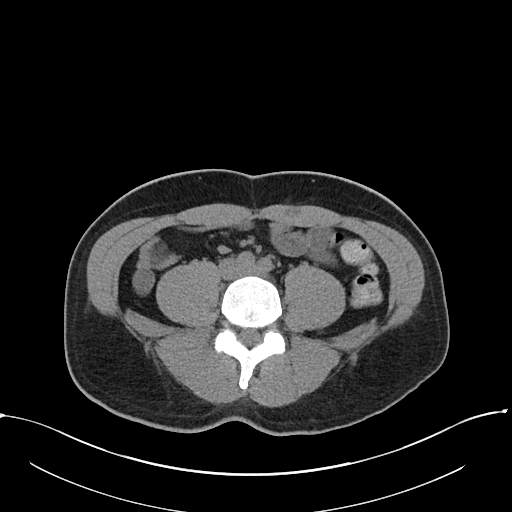
[im 71/121  soft-tissue]
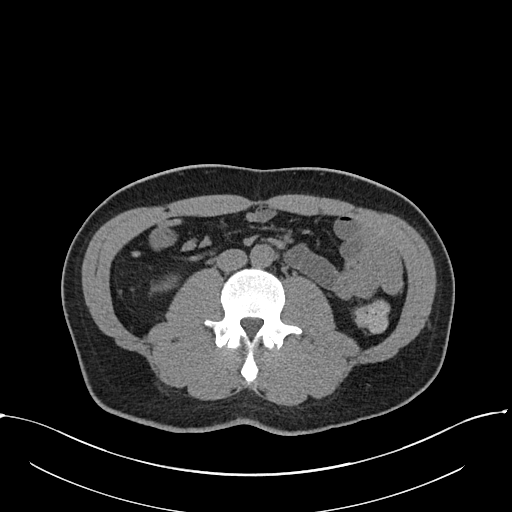
[im 81/121  soft-tissue]
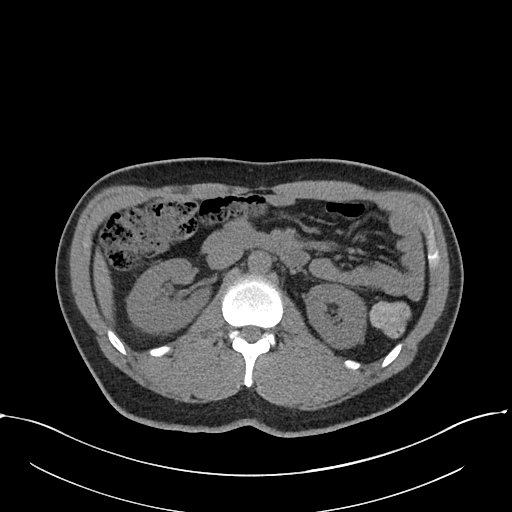
[im 81/121  bone]
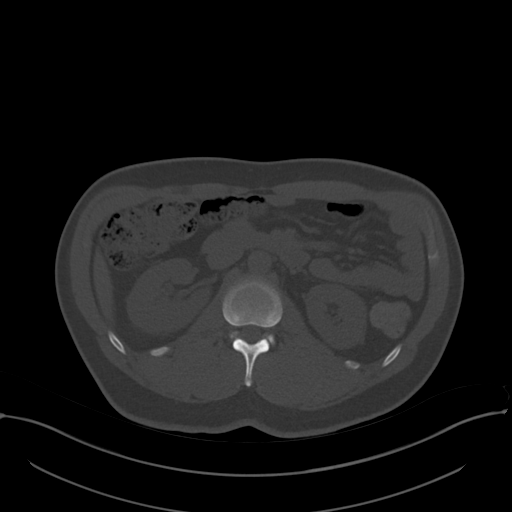
[im 86/121  soft-tissue]
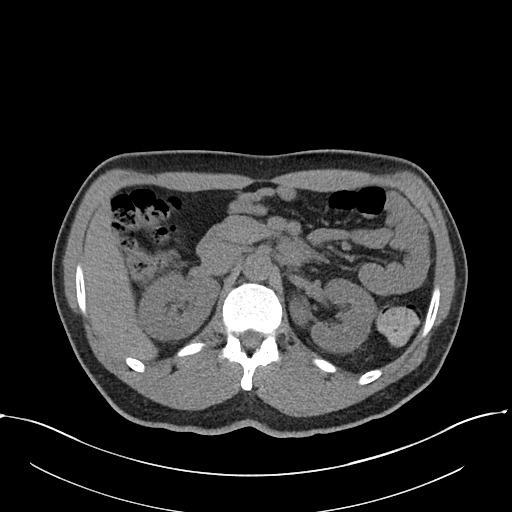
[im 96/121  soft-tissue]
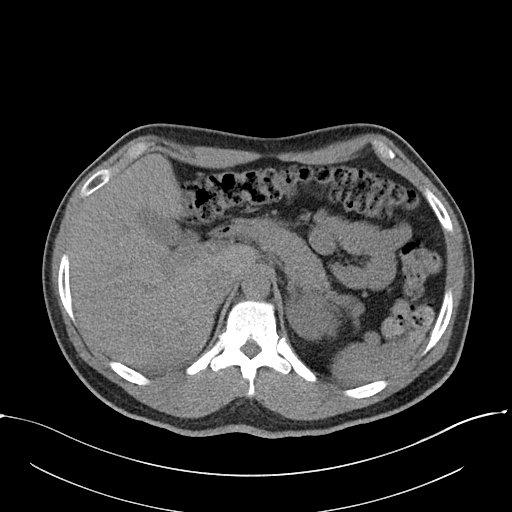
[im 106/121  soft-tissue]
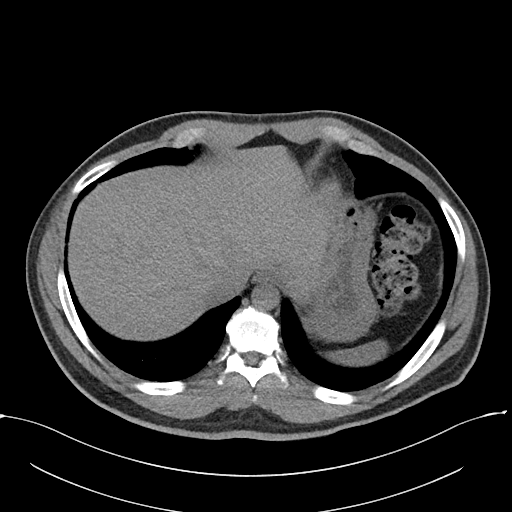
[im 116/121  soft-tissue]
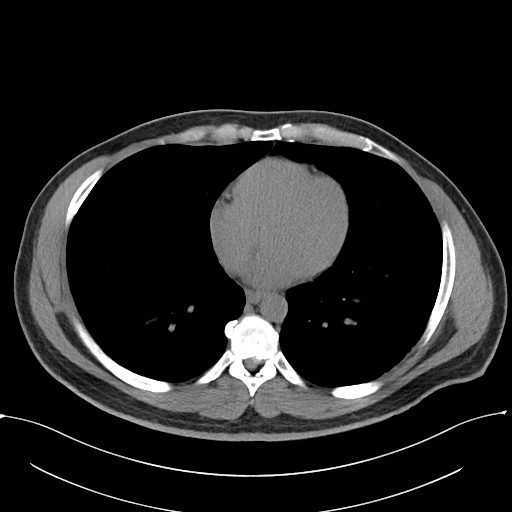

[Series 6: renal stone 3.0 cor · coronal · 0.82mm/px · 3 of 106 slices shown]
[im 36/106  soft-tissue]
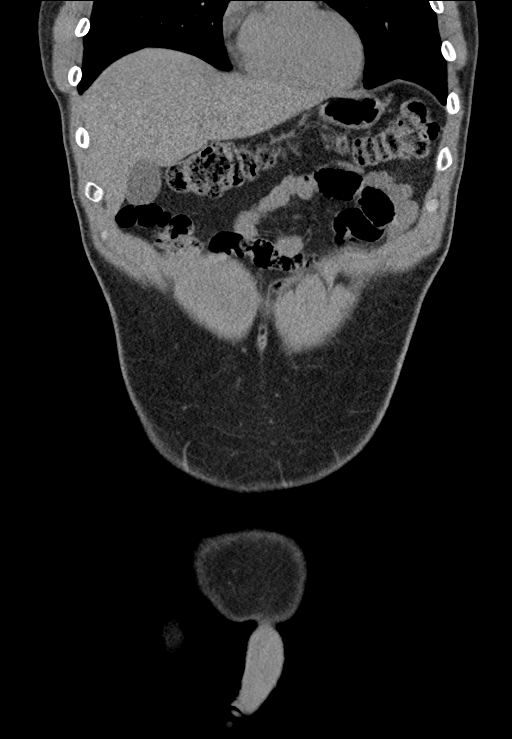
[im 47/106  soft-tissue]
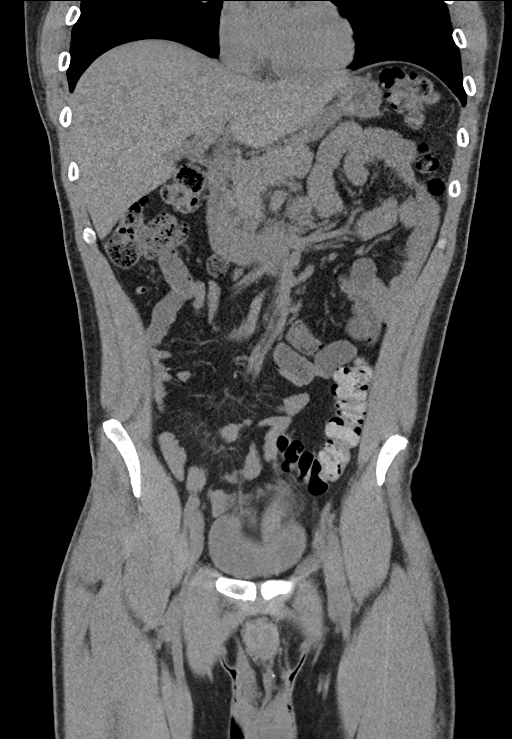
[im 59/106  soft-tissue]
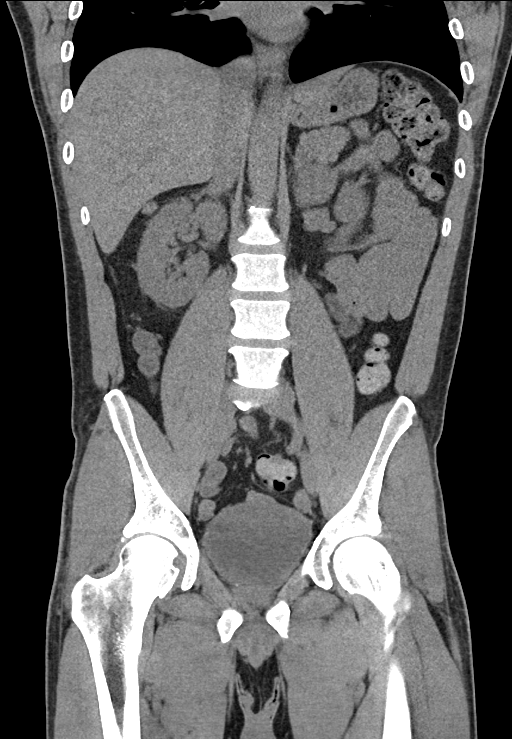

[16 of 46 positions shown; findings below may reference images not displayed]

FINDINGS: Please note that parenchymal abnormalities may be missed without
intravenous contrast.

Lower chest: No acute abnormality

Hepatobiliary: The liver and gallbladder are unremarkable. There is
no evidence of biliary dilatation.

Pancreas: Unremarkable

Spleen: Unremarkable

Adrenals/Urinary Tract: The kidneys, adrenal glands and bladder are
unremarkable.

Stomach/Bowel: Stomach is within normal limits. Appendix appears
normal. No evidence of bowel wall thickening, distention, or
inflammatory changes.

Vascular/Lymphatic: No significant vascular findings are present. No
enlarged abdominal or pelvic lymph nodes.

Reproductive: Prostate is unremarkable.

Other: No ascites, focal collection or pneumoperitoneum.

Musculoskeletal: No acute or significant osseous findings.
IMPRESSION: No acute or significant abnormalities.

## 2018-11-03 ENCOUNTER — Encounter: Payer: Self-pay | Admitting: Internal Medicine

## 2018-11-04 MED ORDER — SILDENAFIL CITRATE 100 MG PO TABS: 50.0000 mg | ORAL_TABLET | Freq: Every day | ORAL | 11 refills | Status: DC | PRN

## 2018-12-14 ENCOUNTER — Other Ambulatory Visit: Payer: Self-pay

## 2018-12-14 DIAGNOSIS — E1142 Type 2 diabetes mellitus with diabetic polyneuropathy: Secondary | ICD-10-CM

## 2018-12-14 DIAGNOSIS — Z794 Long term (current) use of insulin: Secondary | ICD-10-CM

## 2018-12-15 MED ORDER — GABAPENTIN 100 MG PO CAPS: 100.0000 mg | ORAL_CAPSULE | Freq: Three times a day (TID) | ORAL | 1 refills | Status: DC

## 2018-12-15 NOTE — Telephone Encounter (Signed)
Please forward refill request to pt's primary care provider.   

## 2018-12-15 NOTE — Telephone Encounter (Signed)
Done erx 

## 2018-12-15 NOTE — Telephone Encounter (Signed)
Please advise 

## 2018-12-15 NOTE — Telephone Encounter (Signed)
Per Dr. Ellison's request, I am forwarding you this refill request. Please review and refill if appropriate 

## 2018-12-19 NOTE — Telephone Encounter (Signed)
Please advise if refill of gabapentin is appropriate

## 2018-12-22 ENCOUNTER — Encounter: Payer: Self-pay | Admitting: Internal Medicine

## 2019-01-23 ENCOUNTER — Ambulatory Visit: Payer: Self-pay | Admitting: Internal Medicine

## 2019-03-23 ENCOUNTER — Telehealth: Payer: Self-pay | Admitting: Internal Medicine

## 2019-03-23 ENCOUNTER — Other Ambulatory Visit: Payer: Self-pay

## 2019-03-23 DIAGNOSIS — E1142 Type 2 diabetes mellitus with diabetic polyneuropathy: Secondary | ICD-10-CM

## 2019-03-23 MED ORDER — GABAPENTIN 100 MG PO CAPS: 100.0000 mg | ORAL_CAPSULE | Freq: Three times a day (TID) | ORAL | 0 refills | Status: DC

## 2019-03-23 NOTE — Telephone Encounter (Signed)
Medication refill: gabapentin (NEURONTIN) 100 MG capsule [id number]       Pharmacy:  [facility] [address] [place] Phone:  [phone]  Fax:  [phone]        Pt aware of turn around time

## 2019-03-23 NOTE — Telephone Encounter (Signed)
Per office policy sent 30 day to local pharmacy until appt.../lmb  

## 2019-03-28 ENCOUNTER — Other Ambulatory Visit: Payer: Self-pay

## 2019-03-28 ENCOUNTER — Ambulatory Visit (INDEPENDENT_AMBULATORY_CARE_PROVIDER_SITE_OTHER): Payer: 59 | Admitting: Endocrinology

## 2019-03-28 ENCOUNTER — Encounter: Payer: Self-pay | Admitting: Endocrinology

## 2019-03-28 VITALS — BP 140/82 | HR 82 | Ht 75.0 in | Wt 210.0 lb

## 2019-03-28 DIAGNOSIS — E782 Mixed hyperlipidemia: Secondary | ICD-10-CM | POA: Diagnosis not present

## 2019-03-28 DIAGNOSIS — E1142 Type 2 diabetes mellitus with diabetic polyneuropathy: Secondary | ICD-10-CM | POA: Diagnosis not present

## 2019-03-28 DIAGNOSIS — Z794 Long term (current) use of insulin: Secondary | ICD-10-CM | POA: Diagnosis not present

## 2019-03-28 LAB — POCT GLYCOSYLATED HEMOGLOBIN (HGB A1C): Hemoglobin A1C: 12.9 % — AB (ref 4.0–5.6)

## 2019-03-28 LAB — GLUCOSE, POCT (MANUAL RESULT ENTRY): POC Glucose: 441 mg/dl — AB (ref 70–99)

## 2019-03-28 MED ORDER — TOUJEO SOLOSTAR 300 UNIT/ML ~~LOC~~ SOPN: 30.0000 [IU] | PEN_INJECTOR | Freq: Every day | SUBCUTANEOUS | 0 refills | Status: DC

## 2019-03-28 NOTE — Progress Notes (Signed)
24 Patient ID: Barry Phillips, male   DOB: October 31, 1970, 49 y.o.   MRN: [id number]           Reason for Appointment: Follow-up for Type 2 Diabetes    History of Present Illness:          Date of diagnosis of type 2 diabetes mellitus: 2008       Background history:  He thinks his blood sugar at the time of diagnosis was 1000 and he was started on insulin Also at that time he was probably weighing about 260 pounds Also had been on metformin since diagnosis He was treated elsewhere and no records are available He thinks that his insulin regimen has only been with Lantus and he has never taken mealtime insulin In the last few years he has had difficulty with affording insulin and has not had any good control of his diabetes  Recent history:   INSULIN regimen previously: Lantus 24 units at 10 PM, Novolin R 12 units twice daily Currently using 70/30 OTC, 10 units bid  His A1c is now 12.9 and previously 13.9  Non-insulin hypoglycemic drugs the patient is taking are: Metformin 1 g twice daily  Current management, blood sugar patterns and problems identified:  He did not continue his basal bolus insulin regimen after he had difficulties with insurance and cost and did not let us know  He on his own has picked up 70/30 insulin OTC but is taking only 10 units twice daily, previously taking nearly 50 units a day with his other regimen  He has not checked his blood sugar consistently and does not know what his readings are.  Also he thinks that his meter was giving inconsistent blood sugars, occasionally above the high limit  He does not think he is having any unusual fatigue or increased thirst  However his blood sugar is over 400 today in the office  His weight is slightly higher  He is working third shift and irregular mealtimes as well as not planning his meals well  He thinks he is avoiding any drinks with sugar        Side effects from medications have been:  None  Compliance with the medical regimen: Fair  Glucose monitoring:  Not done      Glucometer: Relion       Blood Glucose readings not available  Previous readings: PRE-MEAL Fasting Lunch Dinner Bedtime Overall  Glucose range:  179-298 ?   202, 212  194-332   Mean/median:          Self-care:The diet that the patient has been following is: None    Typical meal intake: Breakfast is usually toast, eggs and oatmeal               Dietician visit, most recent:1/19  Weight history: Maximum previously 260  Wt Readings from Last 3 Encounters:  03/28/19 210 lb (95.3 kg)  07/21/18 206 lb (93.4 kg)  05/06/18 198 lb 12.8 oz (90.2 kg)    Glycemic control:   Lab Results  Component Value Date   HGBA1C 12.9 (A) 03/28/2019   HGBA1C 13.9 (H) 07/21/2018   HGBA1C 13.4 (H) 05/03/2018   Lab Results  Component Value Date   MICROALBUR <0.7 07/21/2018   LDLCALC 238 (H) 07/21/2018   CREATININE 0.94 07/21/2018   Lab Results  Component Value Date   MICRALBCREAT 1.8 07/21/2018    Lab Results  Component Value Date   FRUCTOSAMINE 554 (H) 06/16/2018   FRUCTOSAMINE 495 (  H) 11/18/2017      Allergies as of 03/28/2019   No Known Allergies     Medication List       Accurate as of March 28, 2019 11:59 PM. If you have any questions, ask your nurse or doctor.        STOP taking these medications   insulin glargine 100 UNIT/ML injection Commonly known as: Lantus Replaced by: Toujeo SoloStar 300 UNIT/ML Sopn Stopped by: Elayne Snare, MD     TAKE these medications   aspirin 81 MG chewable tablet Chew 81 mg by mouth daily.   BD Lancet Ultrafine 30G Misc : Check sugar twice a day   gabapentin 100 MG capsule Commonly known as: NEURONTIN Take 1 capsule (100 mg total) by mouth 3 (three) times daily. Overdue for follow-=up appt must see provider for future refills   glucose blood test strip Check twice a day   insulin regular 100 units/mL injection Commonly known as: NovoLIN R  ReliOn Inject 0.1 mLs (10 Units total) into the skin 3 (three) times daily before meals. What changed:   how much to take  additional instructions   Insulin Syringes (Disposable) U-100 1 ML Misc As directed   ReliOn Pen Needles 32G X 4 MM Misc Generic drug: Insulin Pen Needle USE 1  ONCE DAILY TO  INJECT  INSULIN   rosuvastatin 40 MG tablet Commonly known as: Crestor Take 1 tablet (40 mg total) by mouth daily.   sildenafil 100 MG tablet Commonly known as: Viagra Take 0.5-1 tablets (50-100 mg total) by mouth daily as needed for erectile dysfunction.   Toujeo SoloStar 300 UNIT/ML Sopn Generic drug: Insulin Glargine (1 Unit Dial) Inject 30 Units into the skin daily. Replaces: insulin glargine 100 UNIT/ML injection Started by: Elayne Snare, MD       Allergies: No Known Allergies  Past Medical History:  Diagnosis Date  . Diabetes mellitus without complication ([place])   . Hypertension   . Lacunar stroke ([place]) 07/21/2018   Per feb 2020 MRI    Past Surgical History:  Procedure Laterality Date  . FRACTURE SURGERY    . SHOULDER SURGERY Right     Family History  Problem Relation Age of Onset  . Diabetes Father     Social History:  reports that he has been smoking cigarettes. He has a 19.00 pack-year smoking history. He has never used smokeless tobacco. He reports current alcohol use. He reports that he does not use drugs.   Review of Systems   Lipid history: Had been on Crestor for hyperlipidemia given by PCP but he has not taken this regularly, he ran out for about a week or so and only started back about 3 days ago  Previous lipids were significantly high because of noncompliance also   Lab Results  Component Value Date   CHOL 321 (H) 07/21/2018   HDL 44.10 07/21/2018   LDLCALC 238 (H) 07/21/2018   LDLDIRECT 166.0 05/26/2017   TRIG 195.0 (H) 07/21/2018   CHOLHDL 7 07/21/2018           Not on treatment for blood pressure  BP Readings from Last 3 Encounters:   03/28/19 140/82  07/21/18 136/88  05/06/18 140/80    Most recent eye exam was in 2014, has not had follow-up  Most recent foot exam: 06/2017  Gabapentin was previously tried for his neuropathic pains in his feet  Has had chronic back pain   LABS:  Office Visit on 03/28/2019  Component Date Value  Ref Range Status  . POC Glucose 03/28/2019 441* 70 - 99 mg/dl Final  . Hemoglobin A1C 03/28/2019 12.9* 4.0 - 5.6 % Final    Physical Examination:  BP 140/82 (BP Location: Left Arm, Patient Position: Sitting, Cuff Size: Normal)   Pulse 82   Ht 6\' 3"  (1.905 m)   Wt 210 lb (95.3 kg)   SpO2 98%   BMI 26.25 kg/m    ASSESSMENT:  Diabetes insulin-dependent  See history of present illness for detailed discussion of current diabetes management, blood sugar patterns and problems identified     He continues to have A1c is much above his target and now about 13 and similar to previous readings He does not take enough insulin especially basal insulin and only taking a total of 20 units a day since he had to use OTC insulin Also has a [employer] brand meter which he thinks is unreliable Since he is working third shift and eating regular meals he should be better controlled with the basal bolus insulin regimen Discussed adjusting basal insulin based on fasting blood sugars as well as mealtime insulin based on meal size and how his postprandial readings are   HYPERLIPIDEMIA: Emphasized need to take his rosuvastatin daily and not delay refilling his prescription Lipids need to be checked on his next visit     PLAN:    He will stop his 70/30 insulin and start basal bolus regimen again  When able more consistent basal action and 24-hour event he will use Toujeo instead of Lantus  He will start with 30 units every evening  Given him a flowsheet to help adjust the dose by 2 units every 3 days until his morning sugars are at least under 130  Co-pay card given  Since he has variable  mealtimes and meals he will only take the regular insulin before his full meals and adjust the dose between 10 and 14 units based on how much he is eating and more for higher carbohydrate meals  Advised him to avoid high-fat fast food  He will call his insurance to find out which glucose meter they will cover and we will send in a new prescription  To check blood sugars by rotation after meals also  Again we will consider Invokana on the next visit if covered by his insurance  Needs more regular follow-up  Take Crestor daily  Patient Instructions   Toujeo insulin: This insulin provides blood sugar control for up to 24 hours.    Start with 30 units at bedtime daily and increase by 2 units every 3 days until the waking up sugars are under 130.  Then continue the same dose.  If blood sugar is under 90 for 2 days in a row, reduce the dose by 2 units.  Note that this insulin does not control the rise of blood sugar with meals    Regular insulin 10-14 units before each meal based on meal size and Carbs  Check blood sugars on waking up 3-4 days a week  Also check blood sugars about 2 hours after meals and do this after different meals by rotation  Recommended blood sugar levels on waking up are 90-130 and about 2 hours after meal is 130-180  Please bring your blood sugar monitor to each visit, thank you       Elayne Snare 03/29/2019, 8:39 AM   Note: This office note was prepared with Dragon voice recognition system technology. Any transcriptional errors that result from this process  are unintentional.

## 2019-03-28 NOTE — Patient Instructions (Signed)
Toujeo insulin: This insulin provides blood sugar control for up to 24 hours.    Start with 30 units at bedtime daily and increase by 2 units every 3 days until the waking up sugars are under 130.  Then continue the same dose.  If blood sugar is under 90 for 2 days in a row, reduce the dose by 2 units.  Note that this insulin does not control the rise of blood sugar with meals    Regular insulin 10-14 units before each meal based on meal size and Carbs  Check blood sugars on waking up 3-4 days a week  Also check blood sugars about 2 hours after meals and do this after different meals by rotation  Recommended blood sugar levels on waking up are 90-130 and about 2 hours after meal is 130-180  Please bring your blood sugar monitor to each visit, thank you

## 2019-03-30 ENCOUNTER — Encounter: Payer: Self-pay | Admitting: Internal Medicine

## 2019-03-30 DIAGNOSIS — Z0289 Encounter for other administrative examinations: Secondary | ICD-10-CM

## 2019-04-11 ENCOUNTER — Other Ambulatory Visit: Payer: Self-pay

## 2019-04-11 MED ORDER — RELION PEN NEEDLES 32G X 4 MM MISC: 0 refills | Status: DC

## 2019-04-17 ENCOUNTER — Ambulatory Visit: Payer: Self-pay | Admitting: Endocrinology

## 2019-05-08 ENCOUNTER — Ambulatory Visit (INDEPENDENT_AMBULATORY_CARE_PROVIDER_SITE_OTHER): Payer: 59 | Admitting: Internal Medicine

## 2019-05-08 ENCOUNTER — Encounter: Payer: Self-pay | Admitting: Internal Medicine

## 2019-05-08 ENCOUNTER — Other Ambulatory Visit: Payer: Self-pay

## 2019-05-08 ENCOUNTER — Ambulatory Visit: Payer: 59 | Admitting: Endocrinology

## 2019-05-08 VITALS — BP 138/76 | HR 74 | Temp 98.5°F | Ht 75.0 in | Wt 212.6 lb

## 2019-05-08 DIAGNOSIS — G629 Polyneuropathy, unspecified: Secondary | ICD-10-CM | POA: Diagnosis not present

## 2019-05-08 DIAGNOSIS — G5603 Carpal tunnel syndrome, bilateral upper limbs: Secondary | ICD-10-CM | POA: Diagnosis not present

## 2019-05-08 DIAGNOSIS — M2141 Flat foot [pes planus] (acquired), right foot: Secondary | ICD-10-CM

## 2019-05-08 DIAGNOSIS — E611 Iron deficiency: Secondary | ICD-10-CM | POA: Diagnosis not present

## 2019-05-08 DIAGNOSIS — Z0001 Encounter for general adult medical examination with abnormal findings: Secondary | ICD-10-CM | POA: Diagnosis not present

## 2019-05-08 DIAGNOSIS — Z794 Long term (current) use of insulin: Secondary | ICD-10-CM | POA: Diagnosis not present

## 2019-05-08 DIAGNOSIS — E559 Vitamin D deficiency, unspecified: Secondary | ICD-10-CM

## 2019-05-08 DIAGNOSIS — E1142 Type 2 diabetes mellitus with diabetic polyneuropathy: Secondary | ICD-10-CM

## 2019-05-08 DIAGNOSIS — E538 Deficiency of other specified B group vitamins: Secondary | ICD-10-CM

## 2019-05-08 DIAGNOSIS — M2142 Flat foot [pes planus] (acquired), left foot: Secondary | ICD-10-CM

## 2019-05-08 DIAGNOSIS — I6381 Other cerebral infarction due to occlusion or stenosis of small artery: Secondary | ICD-10-CM

## 2019-05-08 LAB — URINALYSIS, ROUTINE W REFLEX MICROSCOPIC
Bilirubin Urine: NEGATIVE
Hgb urine dipstick: NEGATIVE
Ketones, ur: NEGATIVE
Leukocytes,Ua: NEGATIVE
Nitrite: NEGATIVE
RBC / HPF: NONE SEEN (ref 0–?)
Specific Gravity, Urine: 1.015 (ref 1.000–1.030)
Total Protein, Urine: NEGATIVE
Urine Glucose: >=1000 — AB
Urobilinogen, UA: 0.2 (ref 0.0–1.0)
pH: 6 (ref 5.0–8.0)

## 2019-05-08 LAB — PSA: PSA: 0.24 ng/mL (ref 0.10–4.00)

## 2019-05-08 LAB — CBC WITH DIFFERENTIAL/PLATELET
Basophils Absolute: 0 10*3/uL (ref 0.0–0.1)
Basophils Relative: 0.4 % (ref 0.0–3.0)
Eosinophils Absolute: 0.2 10*3/uL (ref 0.0–0.7)
Eosinophils Relative: 2.7 % (ref 0.0–5.0)
HCT: 43.2 % (ref 39.0–52.0)
Hemoglobin: 14.1 g/dL (ref 13.0–17.0)
Lymphocytes Relative: 22.8 % (ref 12.0–46.0)
Lymphs Abs: 1.9 10*3/uL (ref 0.7–4.0)
MCHC: 32.8 g/dL (ref 30.0–36.0)
MCV: 89.1 fl (ref 78.0–100.0)
Monocytes Absolute: 0.5 10*3/uL (ref 0.1–1.0)
Monocytes Relative: 5.6 % (ref 3.0–12.0)
Neutro Abs: 5.6 10*3/uL (ref 1.4–7.7)
Neutrophils Relative %: 68.5 % (ref 43.0–77.0)
Platelets: 219 10*3/uL (ref 150.0–400.0)
RBC: 4.85 Mil/uL (ref 4.22–5.81)
RDW: 13.7 % (ref 11.5–15.5)
WBC: 8.2 10*3/uL (ref 4.0–10.5)

## 2019-05-08 LAB — VITAMIN D 25 HYDROXY (VIT D DEFICIENCY, FRACTURES): VITD: 15.32 ng/mL — ABNORMAL LOW (ref 30.00–100.00)

## 2019-05-08 LAB — TSH: TSH: 1.66 u[IU]/mL (ref 0.35–4.50)

## 2019-05-08 LAB — VITAMIN B12: Vitamin B-12: 535 pg/mL (ref 211–911)

## 2019-05-08 NOTE — Assessment & Plan Note (Signed)
stable overall by history and exam, recent data reviewed with pt, and pt to continue medical treatment as before,  to f/u any worsening symptoms or concerns  

## 2019-05-08 NOTE — Assessment & Plan Note (Signed)
Worsening pain, for podiatry referral

## 2019-05-08 NOTE — Assessment & Plan Note (Signed)
For endo f/u later today

## 2019-05-08 NOTE — Patient Instructions (Signed)
Please wear wrist splints from the pharmacy to both wrists at night until the numbness is improved; if this persists after a few wks or gets worse with pain or weakness, let us know for a referral to [facility] (orthopedic)  You will be contacted regarding the referral for: Podiatry  COVID-19 Vaccine Information can be found at: [email or web address] For questions related to vaccine distribution or appointments, please email [email or web address] or call [phone].   For the COVID vaccine - please call [phone] (option 2) for an appt, but if unable to get through quickly, please go to the web page:   [email or web address] to be placed on the wait list (an email and phone number are required); you can also go to [email or web address] for the [facility] List  Please continue all other medications as before, and refills have been done if requested.  Please have the pharmacy call with any other refills you may need.  Please continue your efforts at being more active, low cholesterol diet, and weight control.  You are otherwise up to date with prevention measures today.  Please keep your appointments with your specialists as you may have planned - Dr Dwyane Dee later today  Please go to the LAB at the blood drawing area for the tests to be done  You will be contacted by phone if any changes need to be made immediately.  Otherwise, you will receive a letter about your results with an explanation, but please check with MyChart first.  Please remember to sign up for MyChart if you have not done so, as this will be important to you in the future with finding out test results, communicating by private email, and scheduling acute appointments online when needed.  Please make an Appointment to return in 6 months, or sooner if needed

## 2019-05-08 NOTE — Assessment & Plan Note (Signed)

## 2019-05-08 NOTE — Assessment & Plan Note (Addendum)
Mild, for bilateral wrist splints  I spent 32 minutes in addition to time for wellness examination in preparing to see the patient by review of recent labs, imaging and procedures, obtaining and reviewing separately obtained history, communicating with the patient and family or caregiver, ordering medications, tests or procedures, and documenting clinical information in the EHR including the differential Dx, treatment, and any further evaluation and other management of bialteral CTS, flat foor pain, neuropathy, DM, lacunar cva

## 2019-05-08 NOTE — Progress Notes (Signed)
Subjective:    Patient ID: [facility], male    DOB: [birth year], 49 y.o.   MRN: [id number]  HPI  Here for wellness and f/u;  Overall doing ok;  Pt denies Chest pain, worsening SOB, DOE, wheezing, orthopnea, PND, worsening LE edema, palpitations, dizziness or syncope.  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  .  Pt denies polydipsia, polyuria, or low sugar symptoms. Pt states overall good compliance with treatment and medications, good tolerability, and has been trying to follow appropriate diet.  Pt denies worsening depressive symptoms, suicidal ideation or panic. No fever, night sweats, wt loss, loss of appetite, or other constitutional symptoms.  Pt states good ability with ADL's, has low fall risk, home safety reviewed and adequate, no other significant changes in hearing or vision, and only occasionally active with exercise.  Has appt 3PM with Dr Dwyane Dee. 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  Gets along ok at work, plans to try some reading glasses.  No new complaintsAsks to see podiatary with flat foot pain left and neuropathy bilateral Past Medical History:  Diagnosis Date  . Diabetes mellitus without complication ([place])   . Hypertension   . 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 FRACTURE SURGERY    . SHOULDER SURGERY Right     reports that he has been smoking cigarettes. He has a 19.00 pack-year smoking history. He has never used smokeless tobacco. He reports current alcohol use. He reports that he does not use drugs. family history includes Diabetes in his father. No Known Allergies Current Outpatient Medications on File Prior to Visit  Medication Sig Dispense Refill  . aspirin 81 MG chewable tablet Chew 81 mg by mouth daily.    Marland Kitchen gabapentin  (NEURONTIN) 100 MG capsule Take 1 capsule (100 mg total) by mouth 3 (three) times daily. Overdue for follow-=up appt must see provider for future refills 90 capsule 0  . glucose blood test strip Check twice a day 100 each 12  . Insulin Glargine, 1 Unit Dial, (TOUJEO SOLOSTAR) 300 UNIT/ML SOPN Inject 30 Units into the skin daily. 3 pen 0  . Insulin Pen Needle (RELION PEN NEEDLES) 32G X 4 MM MISC USE 1  ONCE DAILY TO  INJECT  INSULIN 100 each 0  . insulin regular (NOVOLIN R RELION) 100 units/mL injection Inject 0.1 mLs (10 Units total) into the skin 3 (three) times daily before meals. (Patient taking differently: Inject 8-10 Units into the skin 3 (three) times daily before meals. Inject 10 units at breakfast and lunch and 8 units at dinner.) 10 mL 1  . Insulin Syringes, Disposable, U-100 1 ML MISC As directed 100 each 0  . Lancets (BD LANCET ULTRAFINE 30G) MISC : Check sugar twice a day 100 each 3  . rosuvastatin (CRESTOR) 40 MG tablet Take 1 tablet (40 mg total) by mouth daily. 90 tablet 3  . sildenafil (VIAGRA) 100 MG tablet Take 0.5-1 tablets (50-100 mg total) by mouth daily as needed for erectile dysfunction. 5 tablet 11   No current facility-administered medications on file prior to visit.   Review of Systems All otherwise neg per pt     Objective:   Physical Exam BP 138/76   Pulse 74   Temp  98.5 F (36.9 C)   Ht 6\' 3"  (1.905 m)   Wt 212 lb 9.6 oz (96.4 kg)   SpO2 99%   BMI 26.57 kg/m  VS noted,  Constitutional: Pt appears in NAD HENT: Head: NCAT.  Right Ear: External ear normal.  Left Ear: External ear normal.  Eyes: . Pupils are equal, round, and reactive to light. Conjunctivae and EOM are normal Nose: without d/c or deformity Neck: Neck supple. Gross normal ROM Cardiovascular: Normal rate and regular rhythm.   Pulmonary/Chest: Effort normal and breath sounds without rales or wheezing.  Abd:  Soft, NT, ND, + BS, no organomegaly Neurological: Pt is alert. At baseline  orientation, motor grossly intact Skin: Skin is warm. No rashes, other new lesions, no LE edema Psychiatric: Pt behavior is normal without agitation  All otherwise neg per pt Lab Results  Component Value Date   WBC 8.4 07/21/2018   HGB 15.1 07/21/2018   HCT 44.8 07/21/2018   PLT 215.0 07/21/2018   GLUCOSE 413 (H) 07/21/2018   CHOL 321 (H) 07/21/2018   TRIG 195.0 (H) 07/21/2018   HDL 44.10 07/21/2018   LDLDIRECT 166.0 05/26/2017   LDLCALC 238 (H) 07/21/2018   ALT 15 07/21/2018   AST 13 07/21/2018   NA 134 (L) 07/21/2018   K 5.1 07/21/2018   CL 99 07/21/2018   CREATININE 0.94 07/21/2018   BUN 13 07/21/2018   CO2 28 07/21/2018   TSH 0.95 07/21/2018   PSA 0.30 07/21/2018   HGBA1C 12.9 (A) 03/28/2019   MICROALBUR <0.7 07/21/2018       Assessment & Plan:

## 2019-05-08 NOTE — Assessment & Plan Note (Signed)
For podiatry f/u, may need DM shoes

## 2019-05-09 ENCOUNTER — Other Ambulatory Visit: Payer: Self-pay | Admitting: Internal Medicine

## 2019-05-09 LAB — IBC PANEL
Iron: 63 ug/dL (ref 42–165)
Saturation Ratios: 24.2 % (ref 20.0–50.0)
Transferrin: 186 mg/dL — ABNORMAL LOW (ref 212.0–360.0)

## 2019-05-09 LAB — HEPATIC FUNCTION PANEL
ALT: 29 U/L (ref 0–53)
AST: 17 U/L (ref 0–37)
Albumin: 4.1 g/dL (ref 3.5–5.2)
Alkaline Phosphatase: 99 U/L (ref 39–117)
Bilirubin, Direct: 0.1 mg/dL (ref 0.0–0.3)
Total Bilirubin: 0.2 mg/dL (ref 0.2–1.2)
Total Protein: 7 g/dL (ref 6.0–8.3)

## 2019-05-09 LAB — BASIC METABOLIC PANEL
BUN: 19 mg/dL (ref 6–23)
CO2: 23 mEq/L (ref 19–32)
Calcium: 9.7 mg/dL (ref 8.4–10.5)
Chloride: 103 mEq/L (ref 96–112)
Creatinine, Ser: 1.07 mg/dL (ref 0.40–1.50)
GFR: 89.92 mL/min (ref >60.00)
Glucose, Bld: 409 mg/dL — ABNORMAL HIGH (ref 70–99)
Potassium: 4.3 mEq/L (ref 3.5–5.1)
Sodium: 137 mEq/L (ref 135–145)

## 2019-05-09 LAB — MICROALBUMIN / CREATININE URINE RATIO
Creatinine,U: 53.7 mg/dL
Microalb Creat Ratio: 1.3 mg/g (ref 0.0–30.0)
Microalb, Ur: <0.7 mg/dL (ref 0.0–1.9)

## 2019-05-09 LAB — LIPID PANEL
Cholesterol: 160 mg/dL (ref 0–200)
HDL: 44.7 mg/dL (ref >39.00)
LDL Cholesterol: 89 mg/dL (ref 0–99)
NonHDL: 114.99
Total CHOL/HDL Ratio: 4
Triglycerides: 131 mg/dL (ref 0.0–149.0)
VLDL: 26.2 mg/dL (ref 0.0–40.0)

## 2019-05-09 MED ORDER — VITAMIN D (ERGOCALCIFEROL) 1.25 MG (50000 UNIT) PO CAPS: 50000.0000 [IU] | ORAL_CAPSULE | ORAL | 0 refills | Status: DC

## 2019-05-17 ENCOUNTER — Other Ambulatory Visit: Payer: Self-pay

## 2019-05-17 ENCOUNTER — Ambulatory Visit (INDEPENDENT_AMBULATORY_CARE_PROVIDER_SITE_OTHER): Payer: 59 | Admitting: Podiatry

## 2019-05-17 ENCOUNTER — Encounter: Payer: Self-pay | Admitting: Podiatry

## 2019-05-17 ENCOUNTER — Ambulatory Visit (INDEPENDENT_AMBULATORY_CARE_PROVIDER_SITE_OTHER): Payer: 59

## 2019-05-17 VITALS — BP 122/84 | HR 72 | Temp 97.3°F

## 2019-05-17 DIAGNOSIS — E0843 Diabetes mellitus due to underlying condition with diabetic autonomic (poly)neuropathy: Secondary | ICD-10-CM

## 2019-05-17 DIAGNOSIS — M2141 Flat foot [pes planus] (acquired), right foot: Secondary | ICD-10-CM | POA: Diagnosis not present

## 2019-05-17 DIAGNOSIS — M2142 Flat foot [pes planus] (acquired), left foot: Secondary | ICD-10-CM

## 2019-05-17 DIAGNOSIS — G629 Polyneuropathy, unspecified: Secondary | ICD-10-CM

## 2019-05-20 NOTE — Progress Notes (Signed)
   Subjective:  49 y.o. male with PMHx of diabetes mellitus type II presenting today as a new patient with a chief complaint of intermittent pins and needles sensation of bilateral feet that began about one year ago. He reports associated intermittent numbness, tingling and burning of the feet. He states he works 3rd shift and notices his symptoms flare more during the daytime. He has been taking Gabapentin 100 mg TID prescribed by his PCP and states it is not helping. Patient is here for further evaluation and treatment.   Past Medical History:  Diagnosis Date  . Diabetes mellitus without complication ([place])   . Hypertension   . 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  Dermatology: Skin is warm, dry and supple bilateral lower extremities. Negative for open lesions or macerations.  Vascular: Palpable pedal pulses bilaterally. No edema or erythema noted. Capillary refill within normal limits.  Neurological: Epicritic and protective threshold diminished bilaterally.   Musculoskeletal Exam: Range of motion within normal limits to all pedal and ankle joints bilateral. Muscle strength 5/5 in all groups bilateral.  Upon weightbearing there is a medial longitudinal arch collapse bilaterally. Remove foot valgus noted to the bilateral lower extremities with excessive pronation upon mid stance.  Assessment: 1. pes planus bilateral 2. DM type II - uncontrolled  3. Peripheral polyneuropathy BLE   Plan of Care:  1. Patient was evaluated. 2. Appointment with Liliane Channel, Pedorthist, for custom molded orthotics.  3. Continue taking Gabapentin 100 mg TID as directed by PCP.  4. Recommended good shoe gear.  5. Stressed importance of reducing blood glucose levels.  6. Return to clinic as needed.    Edrick Kins, DPM [facility]  Dr. Edrick Kins, [place]                                        [place], [place] [address]                Office [phone]  Fax [phone]

## 2019-05-26 ENCOUNTER — Other Ambulatory Visit: Payer: 59 | Admitting: Orthotics

## 2019-06-01 ENCOUNTER — Other Ambulatory Visit: Payer: Self-pay

## 2019-06-01 ENCOUNTER — Encounter: Payer: Self-pay | Admitting: Internal Medicine

## 2019-06-01 ENCOUNTER — Ambulatory Visit: Payer: 59 | Admitting: Orthotics

## 2019-06-01 DIAGNOSIS — M2142 Flat foot [pes planus] (acquired), left foot: Secondary | ICD-10-CM

## 2019-06-01 DIAGNOSIS — G629 Polyneuropathy, unspecified: Secondary | ICD-10-CM

## 2019-06-01 DIAGNOSIS — M2141 Flat foot [pes planus] (acquired), right foot: Secondary | ICD-10-CM

## 2019-06-01 DIAGNOSIS — E0843 Diabetes mellitus due to underlying condition with diabetic autonomic (poly)neuropathy: Secondary | ICD-10-CM

## 2019-06-01 NOTE — Progress Notes (Signed)
Due to financial considerations, patient was cast for 1 [id number] f/o; charge $200, he paid $100 down.

## 2019-06-08 ENCOUNTER — Ambulatory Visit: Payer: 59 | Admitting: Endocrinology

## 2019-06-08 DIAGNOSIS — Z0289 Encounter for other administrative examinations: Secondary | ICD-10-CM

## 2019-06-22 ENCOUNTER — Other Ambulatory Visit: Payer: 59 | Admitting: Orthotics

## 2019-06-27 ENCOUNTER — Other Ambulatory Visit: Payer: Self-pay

## 2019-06-27 ENCOUNTER — Ambulatory Visit: Payer: 59 | Admitting: Orthotics

## 2019-06-27 DIAGNOSIS — M2142 Flat foot [pes planus] (acquired), left foot: Secondary | ICD-10-CM

## 2019-06-27 DIAGNOSIS — M2141 Flat foot [pes planus] (acquired), right foot: Secondary | ICD-10-CM

## 2019-06-27 DIAGNOSIS — G629 Polyneuropathy, unspecified: Secondary | ICD-10-CM

## 2019-06-27 DIAGNOSIS — E0843 Diabetes mellitus due to underlying condition with diabetic autonomic (poly)neuropathy: Secondary | ICD-10-CM

## 2019-06-27 NOTE — Progress Notes (Signed)
Patient came in today to pick up custom made foot orthotics.  The goals were accomplished and the patient reported no dissatisfaction with said orthotics.  Patient was advised of breakin period and how to report any issues. 

## 2019-07-06 ENCOUNTER — Other Ambulatory Visit: Payer: Self-pay | Admitting: Internal Medicine

## 2019-07-06 DIAGNOSIS — E1142 Type 2 diabetes mellitus with diabetic polyneuropathy: Secondary | ICD-10-CM

## 2019-07-11 ENCOUNTER — Telehealth: Payer: Self-pay | Admitting: Internal Medicine

## 2019-07-11 DIAGNOSIS — M79671 Pain in right foot: Secondary | ICD-10-CM

## 2019-07-11 DIAGNOSIS — M5416 Radiculopathy, lumbar region: Secondary | ICD-10-CM

## 2019-07-11 NOTE — Telephone Encounter (Signed)
New message:   Pt is calling to see if the doctor can be referred to a back specialists again. He states before when he was referred he lost his insurance and now he has new insurance and would like to be referred again.

## 2019-07-12 NOTE — Telephone Encounter (Signed)
Ok these referrals are done

## 2019-07-12 NOTE — Telephone Encounter (Signed)
Informed pt referral has been sent and someone will be reaching out about appointment date and time.

## 2019-07-21 ENCOUNTER — Encounter: Payer: Self-pay | Admitting: Family Medicine

## 2019-07-21 ENCOUNTER — Other Ambulatory Visit: Payer: Self-pay

## 2019-07-21 ENCOUNTER — Ambulatory Visit (INDEPENDENT_AMBULATORY_CARE_PROVIDER_SITE_OTHER): Payer: 59 | Admitting: Family Medicine

## 2019-07-21 DIAGNOSIS — M5441 Lumbago with sciatica, right side: Secondary | ICD-10-CM

## 2019-07-21 DIAGNOSIS — G8929 Other chronic pain: Secondary | ICD-10-CM

## 2019-07-21 NOTE — Progress Notes (Signed)
Office Visit Note   Patient: Barry Phillips           Date of Birth: [birth year]           MRN: [id number] Visit Date: 07/21/2019 Requested by: Biagio Borg, MD [place],  [place] [address] PCP: Biagio Borg, MD  Subjective: Chief Complaint  Patient presents with  . Lower Back - Pain    Chronic pain in the lower back. Intermittent flare ups. Pain down either or both legs - the shooting pain "'throws me into convulsions." Gabapentin helps with his neuropathy.    HPI: He is here with right-sided lower back pain.  Last year when lifting something heavy, felt a sudden severe pain in the right lower back causing spasms.  Ever since then he has had an intermittent episodes of pain.  He quit his previous job and is now working a less strenuous job and it seems to be helping.  He also is taking gabapentin which makes a big difference for him.  He had an MRI scan in May 2020 which showed a right sided L3-4 disc protrusion impinging upon the right L3 nerve root.  Denies any weakness or numbness in his legs, denies any bowel or bladder incontinence.  He is diabetic and it has been well controlled with insulin.  He also has vitamin D deficiency and is taking medication for this.              ROS:   All other systems were reviewed and are negative.  Objective: Vital Signs: There were no vitals taken for this visit.  Physical Exam:  General:  Alert and oriented, in no acute distress. Pulm:  Breathing unlabored. Psy:  Normal mood, congruent affect. Skin: No rash Low back: He has tightness in the right-sided lumbar paraspinous muscles.  Negative straight leg raise, lower extremity strength and reflexes are normal.  Imaging: No results found.  Assessment & Plan: 1.  Chronic intermittent right-sided low back pain with L3-4 disc protrusion, probable myofascial pain as well. -We will try physical therapy.  Continue with gabapentin.  Could increase the dosage if pain worsens.   If he fails to improve, could contemplate epidural injection.  Chiropractic would be another consideration.     Procedures: No procedures performed  No notes on file     PMFS History: Patient Active Problem List   Diagnosis Date Noted  . Bilateral carpal tunnel syndrome 05/08/2019  . 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 Lumbar facet arthropathy 07/21/2018  . Left lumbar radiculopathy 07/21/2018  . Encounter for general adult medical examination with abnormal findings 05/26/2017  . Flat foot 02/19/2017  . Polyneuropathy 02/19/2017  . Type 2 diabetes mellitus ([place]) 09/18/2016  . Benign skin mole 09/18/2016   Past Medical History:  Diagnosis Date  . Diabetes mellitus without complication ([place])   . Hypertension   . Lacunar stroke ([place]) 07/21/2018   Per feb 2020 MRI    Family History  Problem Relation Age of Onset  . Diabetes Father     Past Surgical History:  Procedure Laterality Date  . FRACTURE SURGERY    . SHOULDER SURGERY Right    Social History   Occupational History  . Occupation: Oncologist  Tobacco Use  . Smoking status: Current Some Day Smoker    Packs/day: 1.00    Years: 19.00    Pack years: 19.00    Types: Cigarettes  . Smokeless tobacco: Never Used  Substance and  Sexual Activity  . Alcohol use: Yes    Comment: Social  . Drug use: No  . Sexual activity: Not on file

## 2019-08-01 ENCOUNTER — Encounter: Payer: Self-pay | Admitting: Rehabilitative and Restorative Service Providers"

## 2019-08-01 ENCOUNTER — Other Ambulatory Visit: Payer: Self-pay

## 2019-08-01 ENCOUNTER — Ambulatory Visit (INDEPENDENT_AMBULATORY_CARE_PROVIDER_SITE_OTHER): Payer: 59 | Admitting: Rehabilitative and Restorative Service Providers"

## 2019-08-01 DIAGNOSIS — M5441 Lumbago with sciatica, right side: Secondary | ICD-10-CM

## 2019-08-01 DIAGNOSIS — G8929 Other chronic pain: Secondary | ICD-10-CM

## 2019-08-01 DIAGNOSIS — R293 Abnormal posture: Secondary | ICD-10-CM

## 2019-08-01 NOTE — Patient Instructions (Signed)
Access Code: [email or web address] URL: [email or web address] Date: 08/01/2019 Prepared by: Vista Mink  Exercises Standing Scapular Retraction - 5-10 x daily - 7 x weekly - 1 sets - 5 reps - 5 hold Standing Lumbar Extension at [place] - 5-10 x daily - 7 x weekly - 1 sets - 5 reps - 3 hold

## 2019-08-01 NOTE — Therapy (Signed)
[facility] [address] [place], [place], [address] Phone: [phone]   Fax:  [phone]  Physical Therapy Evaluation  Patient Details  Name: Barry Phillips MRN: [id number] Date of Birth: 01-12-1971 Referring Provider (PT): Hilts   Encounter Date: 08/01/2019  PT End of Session - 08/01/19 1328    Visit Number  1    Number of Visits  12    Date for PT Re-Evaluation  09/26/19    PT Start Time  0848    PT Stop Time  0932    PT Time Calculation (min)  44 min    Activity Tolerance  Patient tolerated treatment well;No increased pain    Behavior During Therapy  WFL for tasks assessed/performed       Past Medical History:  Diagnosis Date  . Diabetes mellitus without complication ([place])   . Hypertension   . Lacunar stroke ([place]) 07/21/2018   Per feb 2020 MRI    Past Surgical History:  Procedure Laterality Date  . FRACTURE SURGERY    . SHOULDER SURGERY Right     There were no vitals filed for this visit.   Subjective Assessment - 08/01/19 0904    Subjective  Barry Phillips is highly motivated to get rid of the spasm and leg pain.    How long can you sit comfortably?  Not limited but should change positions frequently.    How long can you stand comfortably?  Same.  Need to change positions frequently.    How long can you walk comfortably?  Frequent change of position is good.  Was limited by spasm a few weeks ago.    Patient Stated Goals  See above.    Currently in Pain?  No/denies         [facility] PT Assessment - 08/01/19 0001      Assessment   Medical Diagnosis  Chronic R sided LBP with R sided sciatica    Referring Provider (PT)  Hilts    Onset Date/Surgical Date  07/15/18      Precautions   Precautions  Back      Balance Screen   Has the patient fallen in the past 6 months  No    Has the patient had a decrease in activity level because of a fear of falling?   No    Is the patient reluctant to leave their home because of a fear  of falling?   No      Cognition   Overall Cognitive Status  Within Functional Limits for tasks assessed      Posture/Postural Control   Posture/Postural Control  Postural limitations    Postural Limitations  Rounded Shoulders;Forward head;Decreased lumbar lordosis      ROM / Strength   AROM / PROM / Strength  AROM      AROM   Overall AROM   Deficits    AROM Assessment Site  Lumbar    Lumbar Extension  10                  Objective measurements completed on examination: See above findings.      [facility] Adult PT Treatment/Exercise - 08/01/19 0001      Bed Mobility   Bed Mobility  Rolling Left;Rolling Right;Right Sidelying to Sit;Left Sidelying to Sit;Sit to Supine;Supine to Sit;Sit to Sidelying Right;Sit to Sidelying Left      Therapeutic Activites    Therapeutic Activities  Lifting   Golfer's and diagonal lifts, log and lumbar roll  Exercises   Exercises  Lumbar      Lumbar Exercises: Stretches   Standing Extension  10 reps   3 seconds     Lumbar Exercises: Standing   Scapular Retraction  Strengthening;10 reps   5 seconds            PT Education - 08/01/19 1327    Education Details  Spent 15 minutes reviewing spine anatomy, log roll technique, golfer's and diagonal lifts, discussed the importance of walking and discussed lumbar roll when sitting and the importance of frequent changes of position.    Person(s) Educated  Patient    Methods  Explanation;Demonstration;Handout    Comprehension  Verbalized understanding;Returned demonstration;Verbal cues required;Need further instruction       PT Short Term Goals - 08/01/19 1335      PT SHORT TERM GOAL #1   Jasper will be independent with his HEP    Time  4    Period  Weeks    Status  New    Target Date  08/29/19        PT Long Term Goals - 08/01/19 1336      PT LONG TERM GOAL #1   Title  Larren will report low back pain consistently 0-3/10 on the Numeric Pain Rating Scale with  no leg pain.    Baseline  Internittently 5/10    Time  6    Period  Weeks    Status  New    Target Date  09/26/19      PT LONG TERM GOAL #2   Title  Faye will be able to participate in normal daily activities without pain.    Baseline  Limited with sexual activity.    Time  6    Period  Weeks    Status  New    Target Date  09/26/19      PT LONG TERM GOAL #3   Title  [place] will have at least 20 degrees of standing trunk extension.    Baseline  10 degrees    Time  6    Period  Weeks    Status  New    Target Date  09/26/19      PT LONG TERM GOAL #4   Title  Lawernce will be independent and complaint with his home walking and exercise program at discharge.    Time  6    Period  Weeks    Status  New    Target Date  09/26/19             Plan - 08/01/19 1329    Clinical [place] has long-standing low back pain with intermittent R > L leg sciatica.  He has changed his job as a result but would still like to be more active without having to deal with the pain.  Body mechanics, core strengthening, trunk extension AROM and select LE stretches (hamstrings) will benefit [place] and help him return to normal activities.    Personal Factors and Comorbidities  Comorbidity 1    Comorbidities  Diabetes and diabetic neuropathy    Examination-Activity Limitations  Sit;Bed Mobility;Bend;Lift;Reach Overhead;Carry    Examination-Participation Restrictions  Interpersonal Relationship;Other   Work   Stability/Clinical Decision Making  Stable/Uncomplicated    Clinical Decision Making  Low    Rehab Potential  Good    PT Frequency  2x / week    PT Duration  6 weeks    PT Treatment/Interventions  ADLs/Self Care  Home Management;Cryotherapy;Therapeutic activities;Functional mobility training;Moist Heat;Therapeutic exercise;Neuromuscular re-education;Patient/family education;Manual techniques    PT Next Visit Plan  Spine education.  Add hamstrings stretch and select  standing and prone spine strengthening activities.    PT Home Exercise Plan  Standing hip/trunk extension and shoulder blade pinches.[id number]    Consulted and Agree with Plan of Care  Patient       Patient will benefit from skilled therapeutic intervention in order to improve the following deficits and impairments:  Decreased activity tolerance, Decreased range of motion, Decreased mobility, Decreased strength, Increased muscle spasms, Postural dysfunction, Improper body mechanics, Pain  Visit Diagnosis: Chronic bilateral low back pain with right-sided sciatica  Posture abnormality     Problem List Patient Active Problem List   Diagnosis Date Noted  . Bilateral carpal tunnel syndrome 05/08/2019  . Lacunar stroke ([place]) 07/21/2018  . Lumbar facet arthropathy 07/21/2018  . Left lumbar radiculopathy 07/21/2018  . Encounter for general adult medical examination with abnormal findings 05/26/2017  . Flat foot 02/19/2017  . Polyneuropathy 02/19/2017  . Type 2 diabetes mellitus ([place]) 09/18/2016  . Benign skin mole 09/18/2016    Farley Ly PT, MPT 08/01/2019, 1:40 PM  [facility] Physical Therapy [address] [place], [place], [address] Phone: [phone]   Fax:  [phone]  Name: Sharvil Accetta MRN: [id number] Date of Birth: June 17, 1970

## 2019-08-07 ENCOUNTER — Ambulatory Visit: Payer: 59 | Admitting: Podiatry

## 2019-08-10 ENCOUNTER — Encounter: Payer: 59 | Admitting: Rehabilitative and Restorative Service Providers"

## 2019-08-10 ENCOUNTER — Telehealth: Payer: Self-pay | Admitting: Rehabilitative and Restorative Service Providers"

## 2019-08-10 NOTE — Telephone Encounter (Signed)
Called and left message with reminder that his next appointment is 6/2 at 9:30 AM.  Asked him to call and cancel if unable to attend.

## 2019-08-16 ENCOUNTER — Telehealth: Payer: Self-pay | Admitting: Physical Therapy

## 2019-08-16 ENCOUNTER — Encounter: Payer: 59 | Admitting: Physical Therapy

## 2019-08-16 NOTE — Telephone Encounter (Signed)
LVM for pt as he NS for PT appt today.  This is his 2nd no show, and left message reminding of NS policy.  Per policy all appts after next scheduled appt canceled, and if he doesn't show for next appt will be d/c from PT.  If he comes to appt, will schedule 1-2 sessions only at a time.  Laureen Abrahams, PT, DPT 08/16/19 9:55 AM

## 2019-08-18 ENCOUNTER — Telehealth: Payer: Self-pay | Admitting: Rehabilitative and Restorative Service Providers"

## 2019-08-18 ENCOUNTER — Encounter: Payer: 59 | Admitting: Rehabilitative and Restorative Service Providers"

## 2019-08-18 NOTE — Telephone Encounter (Signed)
Pt. Answered phone call and indicated his work schedule made it hard to get up for early appointments.  Pt. Stated he wanted to reschedule.  Instructions given to Pt. To call office when he was prepared to schedule 1 visit.  Scot Jun, PT, DPT, OCS, ATC 08/18/19  9:00 AM

## 2019-08-21 ENCOUNTER — Encounter: Payer: 59 | Admitting: Rehabilitative and Restorative Service Providers"

## 2019-08-23 ENCOUNTER — Encounter: Payer: 59 | Admitting: Physical Therapy

## 2019-09-01 ENCOUNTER — Encounter: Payer: 59 | Admitting: Rehabilitative and Restorative Service Providers"

## 2019-09-20 ENCOUNTER — Other Ambulatory Visit: Payer: Self-pay

## 2019-09-20 ENCOUNTER — Ambulatory Visit (INDEPENDENT_AMBULATORY_CARE_PROVIDER_SITE_OTHER): Payer: 59 | Admitting: Podiatry

## 2019-09-20 ENCOUNTER — Encounter: Payer: Self-pay | Admitting: Podiatry

## 2019-09-20 DIAGNOSIS — G629 Polyneuropathy, unspecified: Secondary | ICD-10-CM | POA: Diagnosis not present

## 2019-09-20 DIAGNOSIS — M2141 Flat foot [pes planus] (acquired), right foot: Secondary | ICD-10-CM | POA: Diagnosis not present

## 2019-09-20 DIAGNOSIS — E0843 Diabetes mellitus due to underlying condition with diabetic autonomic (poly)neuropathy: Secondary | ICD-10-CM

## 2019-09-20 DIAGNOSIS — M2142 Flat foot [pes planus] (acquired), left foot: Secondary | ICD-10-CM | POA: Diagnosis not present

## 2019-09-20 NOTE — Progress Notes (Signed)
   Subjective:  49 y.o. male with PMHx of diabetes mellitus type II uncontrolled presenting today for follow-up evaluation regarding bilateral foot pain.  Patient continues to take gabapentin 100 mg 3 times daily from his PCP.  Patient states that during his work shift he does experience some swelling to the feet.  He presents for further treatment and evaluation  Past Medical History:  Diagnosis Date  . Diabetes mellitus without complication ([place])   . Hypertension   . 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  Dermatology: Skin is warm, dry and supple bilateral lower extremities. Negative for open lesions or macerations.  Vascular: Palpable pedal pulses bilaterally. No edema or erythema noted. Capillary refill within normal limits.  Neurological: Epicritic and protective threshold diminished bilaterally.   Musculoskeletal Exam: Range of motion within normal limits to all pedal and ankle joints bilateral. Muscle strength 5/5 in all groups bilateral.  Upon weightbearing there is a medial longitudinal arch collapse bilaterally. Rearfoot valgus noted to the bilateral lower extremities with excessive pronation upon mid stance.  Assessment: 1. pes planus bilateral 2. DM type II - uncontrolled  3. Peripheral polyneuropathy BLE  Plan of Care:  1. Patient was evaluated. 2.  Continue custom molded orthotics 3. Continue taking Gabapentin 100 mg TID as directed by PCP.  4.  Continue wearing good supportive shoe gear  5. Stressed importance of reducing blood glucose levels.  6.  Compression ankle sleeves dispensed bilateral.  Wear daily to prevent swelling 7.  Return to clinic as needed  Edrick Kins, DPM [facility]  Dr. Edrick Kins, DPM    [address], [place] [address]                Office [phone]  Fax  [phone]

## 2019-10-04 ENCOUNTER — Ambulatory Visit: Payer: 59 | Admitting: Internal Medicine

## 2019-10-04 ENCOUNTER — Encounter: Payer: Self-pay | Admitting: Internal Medicine

## 2019-10-04 ENCOUNTER — Other Ambulatory Visit: Payer: Self-pay

## 2019-10-04 VITALS — BP 126/80 | HR 72 | Temp 97.8°F | Ht 75.0 in | Wt 208.0 lb

## 2019-10-04 DIAGNOSIS — E559 Vitamin D deficiency, unspecified: Secondary | ICD-10-CM | POA: Diagnosis not present

## 2019-10-04 DIAGNOSIS — Z794 Long term (current) use of insulin: Secondary | ICD-10-CM | POA: Diagnosis not present

## 2019-10-04 DIAGNOSIS — E1142 Type 2 diabetes mellitus with diabetic polyneuropathy: Secondary | ICD-10-CM | POA: Diagnosis not present

## 2019-10-04 DIAGNOSIS — N529 Male erectile dysfunction, unspecified: Secondary | ICD-10-CM | POA: Diagnosis not present

## 2019-10-04 LAB — COMPLETE METABOLIC PANEL WITH GFR
AG Ratio: 1.5 (calc) (ref 1.0–2.5)
ALT: 34 U/L (ref 9–46)
AST: 25 U/L (ref 10–40)
Albumin: 4 g/dL (ref 3.6–5.1)
Alkaline phosphatase (APISO): 85 U/L (ref 36–130)
BUN: 15 mg/dL (ref 7–25)
CO2: 25 mmol/L (ref 20–32)
Calcium: 9.2 mg/dL (ref 8.6–10.3)
Chloride: 104 mmol/L (ref 98–110)
Creat: 1.02 mg/dL (ref 0.60–1.35)
GFR, Est African American: 102 mL/min/{1.73_m2} (ref >=60)
GFR, Est Non African American: 88 mL/min/{1.73_m2} (ref >=60)
Globulin: 2.6 g/dL (calc) (ref 1.9–3.7)
Glucose, Bld: 321 mg/dL — ABNORMAL HIGH (ref 65–99)
Potassium: 4.3 mmol/L (ref 3.5–5.3)
Sodium: 138 mmol/L (ref 135–146)
Total Bilirubin: 0.4 mg/dL (ref 0.2–1.2)
Total Protein: 6.6 g/dL (ref 6.1–8.1)

## 2019-10-04 MED ORDER — TADALAFIL 20 MG PO TABS: 10.0000 mg | ORAL_TABLET | ORAL | 11 refills | Status: DC | PRN

## 2019-10-04 NOTE — Assessment & Plan Note (Signed)
stable overall by history and exam, recent data reviewed with pt, and pt to continue medical treatment as before,  to f/u any worsening symptoms or concerns  

## 2019-10-04 NOTE — Addendum Note (Signed)
Addended by: Cresenciano Lick on: 10/04/2019 08:52 AM   Modules accepted: Orders

## 2019-10-04 NOTE — Assessment & Plan Note (Signed)
Ok to take 2000 u qd

## 2019-10-04 NOTE — Assessment & Plan Note (Signed)
[place] for change viagra to cialis prn

## 2019-10-04 NOTE — Patient Instructions (Addendum)
Please OTC Vitamin D3 at 2000 units per day, indefinitely.  Ok to change the viagra to cialis as needed  Please continue all other medications as before, and refills have been done if requested.  Please have the pharmacy call with any other refills you may need.  Please continue your efforts at being more active, low cholesterol diet, and weight control.  You are otherwise up to date with prevention measures today.  Please keep your appointments with your specialists as you may have planned  Please go to the LAB at the blood drawing area for the tests to be done  You will be contacted by phone if any changes need to be made immediately.  Otherwise, you will receive a letter about your results with an explanation, but please check with MyChart first.  Please remember to sign up for MyChart if you have not done so, as this will be important to you in the future with finding out test results, communicating by private email, and scheduling acute appointments online when needed.  Please make an Appointment to return in 6 months, or sooner if needed

## 2019-10-04 NOTE — Progress Notes (Signed)
Subjective:    Patient ID: [facility], male    DOB: Jun 25, 1970, 49 y.o.   MRN: [id number]  HPI  Here to f/u; overall doing ok,  Pt denies chest pain, increasing sob or doe, wheezing, orthopnea, PND, increased LE swelling, palpitations, dizziness or syncope.  Pt denies new neurological symptoms such as new headache, or facial or extremity weakness or numbness.  Pt denies polydipsia, polyuria, or low sugar episode.  Pt states overall good compliance with meds, mostly trying to follow appropriate diet, with wt overall stable,  but little exercise however. Viagra not working well, asks for change to cialis.  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 He has a 19.00 pack-year smoking history. He has never used smokeless tobacco. He reports current alcohol use. He reports that he does not use drugs. family history includes Diabetes in his father. No Known Allergies Current Outpatient Medications on File Prior to Visit  Medication Sig Dispense Refill   aspirin 81 MG chewable tablet Chew 81 mg by mouth daily.     gabapentin (NEURONTIN) 100 MG capsule Take 1 capsule (100 mg total) by mouth 3 (three) times daily. 90 capsule 3   glucose blood test strip Check twice a day 100 each 12   Insulin Glargine, 1 Unit Dial, (TOUJEO SOLOSTAR) 300 UNIT/ML SOPN Inject 30 Units into the skin daily. 3 pen 0   Insulin Pen Needle (RELION PEN NEEDLES) 32G X 4 MM MISC USE 1  ONCE DAILY TO  INJECT  INSULIN 100 each 0   insulin regular (NOVOLIN R RELION) 100 units/mL injection Inject 0.1 mLs (10 Units total) into the skin 3 (three) times daily before meals. (Patient taking differently: Inject 8-10  Units into the skin 3 (three) times daily before meals. Inject 10 units at breakfast and lunch and 8 units at dinner.) 10 mL 1   Insulin Syringes, Disposable, U-100 1 ML MISC As directed 100 each 0   Lancets (BD LANCET ULTRAFINE 30G) MISC : Check sugar twice a day 100 each 3   rosuvastatin (CRESTOR) 40 MG tablet Take 1 tablet (40 mg total) by mouth daily. 90 tablet 3   sildenafil (VIAGRA) 100 MG tablet Take 0.5-1 tablets (50-100 mg total) by mouth daily as needed for erectile dysfunction. 5 tablet 11   No current facility-administered medications on file prior to visit.   Review of Systems All otherwise neg per pt     Objective:   Physical Exam BP 126/80 (BP Location: Left Arm, Patient Position: Sitting, Cuff Size: Large)    Pulse 72    Temp 97.8 F (36.6 C) (Oral)    Ht 6\' 3"  (1.905 m)    Wt 208 lb (94.3 kg)    SpO2 98%    BMI 26.00 kg/m  VS noted,  Constitutional: Pt appears in NAD HENT: Head: NCAT.  Right Ear: External ear normal.  Left Ear: External ear normal.  Eyes: . Pupils are equal, round, and reactive to light. Conjunctivae and EOM are normal Nose: without d/c or deformity Neck: Neck supple. Gross normal ROM Cardiovascular: Normal rate and regular rhythm.  Pulmonary/Chest: Effort normal and breath sounds without rales or wheezing.  Abd:  Soft, NT, ND, + BS, no organomegaly Neurological: Pt is alert. At baseline orientation, motor grossly intact Skin: Skin is warm. No rashes, other new lesions, no LE edema Psychiatric: Pt behavior is normal without agitation  Lab Results  Component Value Date   WBC 8.2 05/08/2019   HGB 14.1 05/08/2019   HCT 43.2 05/08/2019   PLT 219.0 05/08/2019   GLUCOSE 409 (H) 05/08/2019   CHOL 160 05/08/2019   TRIG 131.0 05/08/2019   HDL 44.70 05/08/2019   LDLDIRECT 166.0 05/26/2017   LDLCALC 89 05/08/2019   ALT 29 05/08/2019   AST 17 05/08/2019   NA 137 05/08/2019   K 4.3 05/08/2019   CL 103 05/08/2019   CREATININE 1.07 05/08/2019    BUN 19 05/08/2019   CO2 23 05/08/2019   TSH 1.66 05/08/2019   PSA 0.24 05/08/2019   HGBA1C 12.9 (A) 03/28/2019   MICROALBUR <0.7 05/08/2019       Assessment & Plan:

## 2019-10-05 ENCOUNTER — Other Ambulatory Visit: Payer: Self-pay | Admitting: Internal Medicine

## 2019-10-05 ENCOUNTER — Encounter: Payer: Self-pay | Admitting: Internal Medicine

## 2019-10-05 DIAGNOSIS — E119 Type 2 diabetes mellitus without complications: Secondary | ICD-10-CM

## 2019-10-05 LAB — LIPID PANEL
Cholesterol: 145 mg/dL (ref <200)
HDL: 44 mg/dL (ref >=40)
LDL Cholesterol (Calc): 81 mg/dL (calc)
Non-HDL Cholesterol (Calc): 101 mg/dL (calc) (ref <130)
Total CHOL/HDL Ratio: 3.3 (calc) (ref <5.0)
Triglycerides: 105 mg/dL (ref <150)

## 2019-10-05 LAB — HEMOGLOBIN A1C
Hgb A1c MFr Bld: 13.8 % of total Hgb — ABNORMAL HIGH (ref <5.7)
Mean Plasma Glucose: 349 (calc)
eAG (mmol/L): 19.4 (calc)

## 2020-01-26 ENCOUNTER — Other Ambulatory Visit: Payer: Self-pay | Admitting: Family

## 2020-01-26 ENCOUNTER — Other Ambulatory Visit: Payer: Self-pay | Admitting: Internal Medicine

## 2020-01-26 DIAGNOSIS — E1142 Type 2 diabetes mellitus with diabetic polyneuropathy: Secondary | ICD-10-CM

## 2020-04-17 ENCOUNTER — Ambulatory Visit (INDEPENDENT_AMBULATORY_CARE_PROVIDER_SITE_OTHER): Payer: 59 | Admitting: Podiatry

## 2020-04-17 ENCOUNTER — Ambulatory Visit (INDEPENDENT_AMBULATORY_CARE_PROVIDER_SITE_OTHER): Payer: 59

## 2020-04-17 ENCOUNTER — Other Ambulatory Visit: Payer: Self-pay

## 2020-04-17 ENCOUNTER — Other Ambulatory Visit: Payer: Self-pay | Admitting: Podiatry

## 2020-04-17 DIAGNOSIS — R600 Localized edema: Secondary | ICD-10-CM | POA: Diagnosis not present

## 2020-04-17 DIAGNOSIS — M2141 Flat foot [pes planus] (acquired), right foot: Secondary | ICD-10-CM

## 2020-04-17 DIAGNOSIS — G629 Polyneuropathy, unspecified: Secondary | ICD-10-CM | POA: Diagnosis not present

## 2020-04-17 DIAGNOSIS — E0843 Diabetes mellitus due to underlying condition with diabetic autonomic (poly)neuropathy: Secondary | ICD-10-CM

## 2020-04-17 DIAGNOSIS — M2142 Flat foot [pes planus] (acquired), left foot: Secondary | ICD-10-CM

## 2020-04-17 DIAGNOSIS — L97522 Non-pressure chronic ulcer of other part of left foot with fat layer exposed: Secondary | ICD-10-CM

## 2020-04-17 DIAGNOSIS — M869 Osteomyelitis, unspecified: Secondary | ICD-10-CM

## 2020-04-17 MED ORDER — SULFAMETHOXAZOLE-TRIMETHOPRIM 800-160 MG PO TABS: 1.0000 | ORAL_TABLET | Freq: Two times a day (BID) | ORAL | 0 refills | Status: DC

## 2020-04-17 NOTE — Progress Notes (Signed)
   Subjective:  50 y.o. male with PMHx of diabetes mellitus type II uncontrolled presenting today for a new complaint regarding pain and swelling to the second toe toe of the left foot that began approximately 2 days ago.  Patient continues to take gabapentin 100 mg 3 times daily from his PCP.  Patient states that during his work shift he does experience some swelling to the feet which has been chronic over the past year.  He presents for further treatment and evaluation  Past Medical History:  Diagnosis Date  . Diabetes mellitus without complication ([place])   . Hypertension   . 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  Dermatology: Skin is warm, dry and supple bilateral lower extremities. Negative for open lesions or macerations.  Vascular: Palpable pedal pulses bilaterally. No edema or erythema noted. Capillary refill within normal limits.  Neurological: Epicritic and protective threshold diminished bilaterally.   Musculoskeletal Exam: Range of motion within normal limits to all pedal and ankle joints bilateral. Muscle strength 5/5 in all groups bilateral.  Upon weightbearing there is a medial longitudinal arch collapse bilaterally. Rearfoot valgus noted to the bilateral lower extremities with excessive pronation upon mid stance.  Radiographic exam: Erosive changes noted to the distal tip of the distal phalanx left second toe findings consistent with osteomyelitis.  Radiolucency also noted within the soft tissue of the distal tip of the toe  Assessment: 1. pes planus bilateral 2. DM type II - uncontrolled  3. Peripheral polyneuropathy BLE 4.  Osteomyelitis left second toe  Plan of Care:  1. Patient was evaluated. 2.  Continue custom molded diabetic orthotics 3. Continue taking Gabapentin 100 mg TID as directed by PCP.  4.  Culture taken today and sent to pathology for culture and  sensitivity 5.  Prescription for Bactrim DS 2 times daily #20 6. Today we discussed the etiology of bone infection into the depth of the second toe.  The patient would benefit from a partial toe amputation.  The conservative versus surgical management of the presenting pathology. The patient opts for surgical management. All possible complications and details of the procedure were explained. All patient questions were answered. No guarantees were expressed or implied. 3. Authorization for surgery was initiated today. Surgery will consist of partial amputation left second toe 4.  Return to clinic 1 week postop  *Works third shift.  Informed him he will need to take a few weeks off of work   Edrick Kins, DPM [facility]  Dr. Edrick Kins, DPM    [address], [place] [address]                Office [phone]  Fax [phone]

## 2020-04-17 NOTE — Addendum Note (Signed)
Addended by: Lind Guest on: 04/17/2020 09:07 AM   Modules accepted: Orders

## 2020-04-19 ENCOUNTER — Telehealth: Payer: Self-pay

## 2020-04-19 NOTE — Telephone Encounter (Signed)
DOS 04/25/2020  APMUTATION TOE IPJ 2ND LT - [id number]  RECEIVED FAX FROM [facility] - NO PRECERT REQUIRED FOR CPT [phone]. REF #[id number]

## 2020-04-21 LAB — WOUND CULTURE
MICRO NUMBER:: 11486916
RESULT:: NO GROWTH
SPECIMEN QUALITY:: ADEQUATE

## 2020-04-21 LAB — HOUSE ACCOUNT TRACKING

## 2020-04-25 ENCOUNTER — Other Ambulatory Visit: Payer: Self-pay | Admitting: Podiatry

## 2020-04-25 DIAGNOSIS — M86672 Other chronic osteomyelitis, left ankle and foot: Secondary | ICD-10-CM

## 2020-04-25 MED ORDER — OXYCODONE-ACETAMINOPHEN 5-325 MG PO TABS: 1.0000 | ORAL_TABLET | ORAL | 0 refills | Status: DC | PRN

## 2020-04-25 NOTE — Progress Notes (Signed)
PRN postop 

## 2020-04-26 ENCOUNTER — Telehealth: Payer: Self-pay | Admitting: Podiatry

## 2020-04-26 NOTE — Telephone Encounter (Signed)
The patient said there is bleeding coming through his bandage. He was told to call if there was any bleeding.

## 2020-05-01 ENCOUNTER — Other Ambulatory Visit: Payer: Self-pay

## 2020-05-01 ENCOUNTER — Ambulatory Visit (INDEPENDENT_AMBULATORY_CARE_PROVIDER_SITE_OTHER): Payer: 59 | Admitting: Podiatry

## 2020-05-01 ENCOUNTER — Ambulatory Visit (INDEPENDENT_AMBULATORY_CARE_PROVIDER_SITE_OTHER): Payer: 59

## 2020-05-01 DIAGNOSIS — Z9889 Other specified postprocedural states: Secondary | ICD-10-CM

## 2020-05-01 NOTE — Progress Notes (Signed)
° °  Subjective:  Patient presents today status post partial toe amputation to the left foot secondary to diabetes mellitus and osteomyelitis of the toe. DOS: 04/25/2020.  Patient states that he is feeling well.  He has no pain associated to the surgical area.  He has been weightbearing in the postsurgical shoe as instructed.  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  Skin incisions appear to be well coapted with sutures intact. No sign of infectious process noted. No dehiscence. No active bleeding noted.  Minimal edema noted to the surgical toe  Radiographic Exam:  Osteotomy site appears to be stable with routine healing.  Assessment: 1. s/p partial toe amputation left second. DOS: 04/25/2020   Plan of Care:  1. Patient was evaluated. X-rays reviewed.  Sutures left intact today 2.  Patient may begin washing and showering and getting the foot wet. 3.  Patient may also transition out of the postsurgical shoe.  Recommend good supportive sneakers 4.  Recommend antibiotic ointment and a Band-Aid daily.  Betadine ointment provided 5.  Return to clinic in 10 days for suture removal  Edrick Kins, DPM [facility]  Dr. Edrick Kins, DPM    [address], [place] [address]                Office [phone]  Fax [phone]

## 2020-05-08 ENCOUNTER — Encounter: Payer: 59 | Admitting: Podiatry

## 2020-05-13 ENCOUNTER — Ambulatory Visit (INDEPENDENT_AMBULATORY_CARE_PROVIDER_SITE_OTHER): Payer: 59

## 2020-05-13 ENCOUNTER — Other Ambulatory Visit: Payer: Self-pay

## 2020-05-13 ENCOUNTER — Ambulatory Visit (INDEPENDENT_AMBULATORY_CARE_PROVIDER_SITE_OTHER): Payer: 59 | Admitting: Podiatry

## 2020-05-13 DIAGNOSIS — Z9889 Other specified postprocedural states: Secondary | ICD-10-CM

## 2020-05-13 NOTE — Progress Notes (Signed)
   Subjective:  Patient presents today status post partial toe amputation to the left foot secondary to diabetes mellitus and osteomyelitis of the toe. DOS: 04/25/2020.  Patient states that he is doing well.  No new complaints at this time Past Medical History:  Diagnosis Date  . Diabetes mellitus without complication ([place])   . Hypertension   . 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  Skin incisions appear to be well coapted with sutures intact. No sign of infectious process noted. No dehiscence. No active bleeding noted.  Minimal edema noted to the surgical toe  Radiographic Exam:  Osteotomy site appears to be stable with routine healing.  Assessment: 1. s/p partial toe amputation left second. DOS: 04/25/2020   Plan of Care:  1. Patient was evaluated. X-rays reviewed.   2.  Sutures removed today 3.  Patient may resume full activity no restrictions 4.  Return to clinic as needed   Edrick Kins, DPM [facility]  Dr. Edrick Kins, DPM    [address], [place] [address]                Office [phone]  Fax [phone]

## 2020-05-20 ENCOUNTER — Encounter: Payer: 59 | Admitting: Podiatry

## 2020-10-28 ENCOUNTER — Other Ambulatory Visit: Payer: Self-pay | Admitting: Internal Medicine

## 2020-10-28 DIAGNOSIS — Z794 Long term (current) use of insulin: Secondary | ICD-10-CM

## 2020-10-28 DIAGNOSIS — E1142 Type 2 diabetes mellitus with diabetic polyneuropathy: Secondary | ICD-10-CM

## 2020-10-28 NOTE — Telephone Encounter (Signed)
Ok to let pt know -   I did gabapentin refill x 1 mo  Please to make ROV for further refills

## 2020-10-31 ENCOUNTER — Other Ambulatory Visit: Payer: Self-pay | Admitting: Internal Medicine

## 2020-10-31 DIAGNOSIS — Z794 Long term (current) use of insulin: Secondary | ICD-10-CM

## 2020-10-31 DIAGNOSIS — E1142 Type 2 diabetes mellitus with diabetic polyneuropathy: Secondary | ICD-10-CM

## 2020-10-31 NOTE — Telephone Encounter (Signed)
Please to call pt   Gabapentin refilled x 1 mo  Please to make ROV for further refills as last ROV was July 2021

## 2020-11-11 ENCOUNTER — Ambulatory Visit (INDEPENDENT_AMBULATORY_CARE_PROVIDER_SITE_OTHER): Payer: 59 | Admitting: Internal Medicine

## 2020-11-11 ENCOUNTER — Other Ambulatory Visit: Payer: Self-pay

## 2020-11-11 ENCOUNTER — Encounter: Payer: Self-pay | Admitting: Internal Medicine

## 2020-11-11 VITALS — BP 128/80 | HR 88 | Temp 98.3°F | Ht 75.0 in | Wt 209.0 lb

## 2020-11-11 DIAGNOSIS — E559 Vitamin D deficiency, unspecified: Secondary | ICD-10-CM | POA: Diagnosis not present

## 2020-11-11 DIAGNOSIS — E538 Deficiency of other specified B group vitamins: Secondary | ICD-10-CM

## 2020-11-11 DIAGNOSIS — Z1211 Encounter for screening for malignant neoplasm of colon: Secondary | ICD-10-CM

## 2020-11-11 DIAGNOSIS — N529 Male erectile dysfunction, unspecified: Secondary | ICD-10-CM

## 2020-11-11 DIAGNOSIS — F17200 Nicotine dependence, unspecified, uncomplicated: Secondary | ICD-10-CM

## 2020-11-11 DIAGNOSIS — H538 Other visual disturbances: Secondary | ICD-10-CM | POA: Diagnosis not present

## 2020-11-11 DIAGNOSIS — Z1159 Encounter for screening for other viral diseases: Secondary | ICD-10-CM

## 2020-11-11 DIAGNOSIS — Z0001 Encounter for general adult medical examination with abnormal findings: Secondary | ICD-10-CM | POA: Diagnosis not present

## 2020-11-11 DIAGNOSIS — Z794 Long term (current) use of insulin: Secondary | ICD-10-CM

## 2020-11-11 DIAGNOSIS — E1142 Type 2 diabetes mellitus with diabetic polyneuropathy: Secondary | ICD-10-CM

## 2020-11-11 LAB — VITAMIN D 25 HYDROXY (VIT D DEFICIENCY, FRACTURES): VITD: 18.77 ng/mL — ABNORMAL LOW (ref 30.00–100.00)

## 2020-11-11 LAB — BASIC METABOLIC PANEL
BUN: 14 mg/dL (ref 6–23)
CO2: 27 mEq/L (ref 19–32)
Calcium: 10 mg/dL (ref 8.4–10.5)
Chloride: 100 mEq/L (ref 96–112)
Creatinine, Ser: 0.96 mg/dL (ref 0.40–1.50)
GFR: 93.95 mL/min (ref >60.00)
Glucose, Bld: 337 mg/dL — ABNORMAL HIGH (ref 70–99)
Potassium: 4.6 mEq/L (ref 3.5–5.1)
Sodium: 135 mEq/L (ref 135–145)

## 2020-11-11 LAB — URINALYSIS, ROUTINE W REFLEX MICROSCOPIC
Bilirubin Urine: NEGATIVE
Hgb urine dipstick: NEGATIVE
Ketones, ur: 15 — AB
Leukocytes,Ua: NEGATIVE
Nitrite: NEGATIVE
RBC / HPF: NONE SEEN (ref 0–?)
Specific Gravity, Urine: 1.02 (ref 1.000–1.030)
Total Protein, Urine: NEGATIVE
Urine Glucose: >=1000 — AB
Urobilinogen, UA: 0.2 (ref 0.0–1.0)
WBC, UA: NONE SEEN (ref 0–?)
pH: 5.5 (ref 5.0–8.0)

## 2020-11-11 LAB — LIPID PANEL
Cholesterol: 352 mg/dL — ABNORMAL HIGH (ref 0–200)
HDL: 46.7 mg/dL (ref >39.00)
LDL Cholesterol: 265 mg/dL — ABNORMAL HIGH (ref 0–99)
NonHDL: 304.91
Total CHOL/HDL Ratio: 8
Triglycerides: 198 mg/dL — ABNORMAL HIGH (ref 0.0–149.0)
VLDL: 39.6 mg/dL (ref 0.0–40.0)

## 2020-11-11 LAB — CBC WITH DIFFERENTIAL/PLATELET
Basophils Absolute: 0 10*3/uL (ref 0.0–0.1)
Basophils Relative: 0.6 % (ref 0.0–3.0)
Eosinophils Absolute: 0.2 10*3/uL (ref 0.0–0.7)
Eosinophils Relative: 2.7 % (ref 0.0–5.0)
HCT: 45.2 % (ref 39.0–52.0)
Hemoglobin: 15.1 g/dL (ref 13.0–17.0)
Lymphocytes Relative: 26.5 % (ref 12.0–46.0)
Lymphs Abs: 2.1 10*3/uL (ref 0.7–4.0)
MCHC: 33.4 g/dL (ref 30.0–36.0)
MCV: 88.2 fl (ref 78.0–100.0)
Monocytes Absolute: 0.4 10*3/uL (ref 0.1–1.0)
Monocytes Relative: 4.8 % (ref 3.0–12.0)
Neutro Abs: 5.1 10*3/uL (ref 1.4–7.7)
Neutrophils Relative %: 65.4 % (ref 43.0–77.0)
Platelets: 249 10*3/uL (ref 150.0–400.0)
RBC: 5.12 Mil/uL (ref 4.22–5.81)
RDW: 13.8 % (ref 11.5–15.5)
WBC: 7.7 10*3/uL (ref 4.0–10.5)

## 2020-11-11 LAB — HEPATIC FUNCTION PANEL
ALT: 21 U/L (ref 0–53)
AST: 18 U/L (ref 0–37)
Albumin: 4.2 g/dL (ref 3.5–5.2)
Alkaline Phosphatase: 115 U/L (ref 39–117)
Bilirubin, Direct: 0.1 mg/dL (ref 0.0–0.3)
Total Bilirubin: 0.6 mg/dL (ref 0.2–1.2)
Total Protein: 7.7 g/dL (ref 6.0–8.3)

## 2020-11-11 LAB — MICROALBUMIN / CREATININE URINE RATIO
Creatinine,U: 65 mg/dL
Microalb Creat Ratio: 6.4 mg/g (ref 0.0–30.0)
Microalb, Ur: 4.1 mg/dL — ABNORMAL HIGH (ref 0.0–1.9)

## 2020-11-11 LAB — TSH: TSH: 1.27 u[IU]/mL (ref 0.35–5.50)

## 2020-11-11 LAB — VITAMIN B12: Vitamin B-12: 581 pg/mL (ref 211–911)

## 2020-11-11 LAB — HEMOGLOBIN A1C: Hgb A1c MFr Bld: 13.4 % — ABNORMAL HIGH (ref 4.6–6.5)

## 2020-11-11 LAB — PSA: PSA: 0.37 ng/mL (ref 0.10–4.00)

## 2020-11-11 MED ORDER — INSULIN SYRINGES (DISPOSABLE) U-100 1 ML MISC: 3 refills | Status: DC

## 2020-11-11 MED ORDER — ROSUVASTATIN CALCIUM 40 MG PO TABS: 40.0000 mg | ORAL_TABLET | Freq: Every day | ORAL | 3 refills | Status: DC

## 2020-11-11 MED ORDER — GABAPENTIN 100 MG PO CAPS: 100.0000 mg | ORAL_CAPSULE | Freq: Three times a day (TID) | ORAL | 1 refills | Status: DC

## 2020-11-11 MED ORDER — RELION PEN NEEDLES 32G X 4 MM MISC: 3 refills | Status: DC

## 2020-11-11 MED ORDER — INSULIN REGULAR HUMAN 100 UNIT/ML IJ SOLN: 10.0000 [IU] | Freq: Three times a day (TID) | INTRAMUSCULAR | 1 refills | Status: DC

## 2020-11-11 MED ORDER — BD LANCET ULTRAFINE 30G MISC: 3 refills | Status: DC

## 2020-11-11 MED ORDER — GLUCOSE BLOOD VI STRP: ORAL_STRIP | 12 refills | Status: DC

## 2020-11-11 MED ORDER — TOUJEO SOLOSTAR 300 UNIT/ML ~~LOC~~ SOPN: 30.0000 [IU] | PEN_INJECTOR | Freq: Every day | SUBCUTANEOUS | 3 refills | Status: DC

## 2020-11-11 NOTE — Progress Notes (Signed)
Patient ID: Barry Phillips, male   DOB: 01-16-1971, 50 y.o.   MRN: [id number]         Chief Complaint:: wellness exam and f/u dm, ED , low vit D, hx of stroke, smoking       HPI:  Barry Phillips is a 50 y.o. male here for wellness exam; due for eye exam, hep c screen, colonoscopy but decliens covid booster, flu shot, pneumovax, prevnar - o/w up to date with preventive referrals and immunizations                        Also not taking Vit D.  Pt denies chest pain, increased sob or doe, wheezing, orthopnea, PND, increased LE swelling, palpitations, dizziness or syncope.   Pt denies polydipsia, polyuria, or new focal neuro s/s.    Pt denies fever, wt loss, night sweats, loss of appetite, or other constitutional symptoms  Denies urinary symptoms such as dysuria, frequency, urgency, flank pain, hematuria or n/v, fever, chills, but does have worsening ED symptoms in the past year, with PDE5 no longer working.  No other new compalints   Wt Readings from Last 3 Encounters:  11/11/20 209 lb (94.8 kg)  10/04/19 208 lb (94.3 kg)  05/08/19 212 lb 9.6 oz (96.4 kg)   BP Readings from Last 3 Encounters:  11/11/20 128/80  10/04/19 126/80  05/17/19 122/84   Immunization History  Administered Date(s) Administered   PFIZER(Purple Top)SARS-COV-2 Vaccination 10/23/2019, 11/13/2019   Tdap 07/21/2018   Health Maintenance Due  Topic Date Due   OPHTHALMOLOGY EXAM  Never done   Hepatitis C Screening  Never done   COLONOSCOPY (Pts 45-2yr Insurance coverage will need to be confirmed)  Never done      Past Medical History:  Diagnosis Date   Diabetes mellitus without complication ([place]    Hypertension    [place]   Per feb 2020 MRI   Past Surgical History:  Procedure Laterality Date   FRACTURE SURGERY     SHOULDER SURGERY Right    TOE SURGERY      reports that he has been smoking cigarettes. He has a 19.00 pack-year smoking history. He has never used smokeless  tobacco. He reports current alcohol use. He reports that he does not use drugs. family history includes Diabetes in his father. No Known Allergies Current Outpatient Medications on File Prior to Visit  Medication Sig Dispense Refill   aspirin 81 MG chewable tablet Chew 81 mg by mouth daily.     NOVOLOG FLEXPEN 100 UNIT/ML FlexPen Inject into the skin.     No current facility-administered medications on file prior to visit.        ROS:  All others reviewed and negative.  Objective        PE:  BP 128/80 (BP Location: Left Arm, Patient Position: Sitting, Cuff Size: Large)   Pulse 88   Temp 98.3 F (36.8 C) (Oral)   Ht '6\' 3"'$  (1.905 m)   Wt 209 lb (94.8 kg)   SpO2 98%   BMI 26.12 kg/m                 Constitutional: Pt appears in NAD               HENT: Head: NCAT.                Right Ear: External ear normal.  Left Ear: External ear normal.                Eyes: . Pupils are equal, round, and reactive to light. Conjunctivae and EOM are normal               Nose: without d/c or deformity               Neck: Neck supple. Gross normal ROM               Cardiovascular: Normal rate and regular rhythm.                 Pulmonary/Chest: Effort normal and breath sounds without rales or wheezing.                Abd:  Soft, NT, ND, + BS, no organomegaly               Neurological: Pt is alert. At baseline orientation, motor grossly intact               Skin: Skin is warm. No rashes, no other new lesions, LE edema - none               Psychiatric: Pt behavior is normal without agitation   Micro: none  Cardiac tracings I have personally interpreted today:  none  Pertinent Radiological findings (summarize): none   Lab Results  Component Value Date   WBC 7.7 11/11/2020   HGB 15.1 11/11/2020   HCT 45.2 11/11/2020   PLT 249.0 11/11/2020   GLUCOSE 337 (H) 11/11/2020   CHOL 352 (H) 11/11/2020   TRIG 198.0 (H) 11/11/2020   HDL 46.70 11/11/2020   LDLDIRECT 166.0 05/26/2017    LDLCALC 265 (H) 11/11/2020   ALT 21 11/11/2020   AST 18 11/11/2020   NA 135 11/11/2020   K 4.6 11/11/2020   CL 100 11/11/2020   CREATININE 0.96 11/11/2020   BUN 14 11/11/2020   CO2 27 11/11/2020   TSH 1.27 11/11/2020   PSA 0.37 11/11/2020   HGBA1C 13.4 (H) 11/11/2020   MICROALBUR 4.1 (H) 11/11/2020   Assessment/Plan:  Barry Phillips is a 50 y.o. Black or African American [2] male with  has a past medical history of Diabetes mellitus without complication ([place]), Hypertension, and Lacunar stroke ([place]) (07/21/2018).  Encounter for general adult medical examination with abnormal findings Age and sex appropriate education and counseling updated with regular exercise and diet Referrals for preventative services - refer eye exam, colonoscopy, hep c screen with labs Immunizations addressed - declines covid booster, flu shot, pneumovax/prevnar Smoking counseling  - counseled to quit, pt not ready Evidence for depression or other mood disorder - none significant Most recent labs reviewed. I have personally reviewed and have noted: 1) the patient's medical and social history 2) The patient's current medications and supplements 3) The patient's height, weight, and BMI have been recorded in the chart   Smoker Counseled to quit, pt not ready  Vitamin D deficiency Last vitamin D Lab Results  Component Value Date   VD25OH 18.77 (L) 11/11/2020   Low, to start oral replacement   Type 2 diabetes mellitus ([place]) Lab Results  Component Value Date   HGBA1C 13.4 (H) 11/11/2020   Severe uncontrolled, to inreased toujeo to 30 units, refer endo and opthomology   Erectile dysfunction Severe, PDE5 not working well, for urology referral per pt request  Followup: Return in about 6 months (around 05/13/2021).  [name], MD 11/14/2020 8:20  PM [facility]

## 2020-11-11 NOTE — Patient Instructions (Signed)
Please continue all other medications as before, and refills have been done if requested.  Please have the pharmacy call with any other refills you may need.  Please continue your efforts at being more active, low cholesterol diet, and weight control.  You are otherwise up to date with prevention measures today.  Please keep your appointments with your specialists as you may have planned  You will be contacted regarding the referral for: diabetes doctor (endocrinology), urology for ED, eye doctor, and for colonoscopy  Please go to the LAB at the blood drawing area for the tests to be done  You will be contacted by phone if any changes need to be made immediately.  Otherwise, you will receive a letter about your results with an explanation, but please check with MyChart first.  Please remember to sign up for MyChart if you have not done so, as this will be important to you in the future with finding out test results, communicating by private email, and scheduling acute appointments online when needed.  Please make an Appointment to return in 6 months, or sooner if needed

## 2020-11-14 ENCOUNTER — Encounter: Payer: Self-pay | Admitting: Internal Medicine

## 2020-11-14 DIAGNOSIS — F17200 Nicotine dependence, unspecified, uncomplicated: Secondary | ICD-10-CM | POA: Insufficient documentation

## 2020-11-14 NOTE — Assessment & Plan Note (Signed)
Age and sex appropriate education and counseling updated with regular exercise and diet Referrals for preventative services - refer eye exam, colonoscopy, hep c screen with labs Immunizations addressed - declines covid booster, flu shot, pneumovax/prevnar Smoking counseling  - counseled to quit, pt not ready Evidence for depression or other mood disorder - none significant Most recent labs reviewed. I have personally reviewed and have noted: 1) the patient's medical and social history 2) The patient's current medications and supplements 3) The patient's height, weight, and BMI have been recorded in the chart

## 2020-11-14 NOTE — Assessment & Plan Note (Signed)
Last vitamin D Lab Results  Component Value Date   VD25OH 18.77 (L) 11/11/2020   Low, to start oral replacement

## 2020-11-14 NOTE — Assessment & Plan Note (Signed)
Lab Results  Component Value Date   HGBA1C 13.4 (H) 11/11/2020   Severe uncontrolled, to inreased toujeo to 30 units, refer endo and opthomology

## 2020-11-14 NOTE — Assessment & Plan Note (Signed)
Severe, PDE5 not working well, for urology referral per pt request

## 2020-11-14 NOTE — Assessment & Plan Note (Signed)
Counseled to quit, pt not ready 

## 2021-03-24 ENCOUNTER — Encounter: Payer: Self-pay | Admitting: Gastroenterology

## 2021-04-15 ENCOUNTER — Ambulatory Visit (AMBULATORY_SURGERY_CENTER): Payer: PRIVATE HEALTH INSURANCE | Admitting: *Deleted

## 2021-04-15 ENCOUNTER — Other Ambulatory Visit: Payer: Self-pay

## 2021-04-15 VITALS — Ht 75.0 in | Wt 210.0 lb

## 2021-04-15 DIAGNOSIS — Z1211 Encounter for screening for malignant neoplasm of colon: Secondary | ICD-10-CM

## 2021-04-15 NOTE — Progress Notes (Signed)

## 2021-04-27 ENCOUNTER — Other Ambulatory Visit: Payer: Self-pay | Admitting: Internal Medicine

## 2021-04-27 DIAGNOSIS — E1142 Type 2 diabetes mellitus with diabetic polyneuropathy: Secondary | ICD-10-CM

## 2021-04-29 ENCOUNTER — Ambulatory Visit (AMBULATORY_SURGERY_CENTER): Payer: Managed Care, Other (non HMO) | Admitting: Gastroenterology

## 2021-04-29 ENCOUNTER — Encounter: Payer: Self-pay | Admitting: Gastroenterology

## 2021-04-29 VITALS — BP 109/71 | HR 70 | Temp 96.6°F | Resp 11 | Ht 75.0 in | Wt 210.0 lb

## 2021-04-29 DIAGNOSIS — Z1211 Encounter for screening for malignant neoplasm of colon: Secondary | ICD-10-CM

## 2021-04-29 DIAGNOSIS — D122 Benign neoplasm of ascending colon: Secondary | ICD-10-CM

## 2021-04-29 MED ORDER — SODIUM CHLORIDE 0.9 % IV SOLN: 500.0000 mL | Freq: Once | INTRAVENOUS | Status: DC

## 2021-04-29 NOTE — Progress Notes (Signed)
Called to room to assist during endoscopic procedure.  Patient ID and intended procedure confirmed with present staff. Received instructions for my participation in the procedure from the performing physician.  

## 2021-04-29 NOTE — Progress Notes (Signed)
To Pacu, VSS. Report to [email or web address] 

## 2021-04-29 NOTE — Op Note (Signed)
[place] Patient Name: Barry Phillips Procedure Date: 04/29/2021 10:42 AM MRN: [id number] Endoscopist: Justice Britain , MD Age: 51 Referring MD:  Date of Birth: 1970-09-18 Gender: Male Account #: [id number] Procedure:                Colonoscopy Indications:              Screening for colorectal malignant neoplasm, This                            is the patient's first colonoscopy Medicines:                Monitored Anesthesia Care Procedure:                Pre-Anesthesia Assessment:                           - Prior to the procedure, a History and Physical                            was performed, and patient medications and                            allergies were reviewed. The patient's tolerance of                            previous anesthesia was also reviewed. The risks                            and benefits of the procedure and the sedation                            options and risks were discussed with the patient.                            All questions were answered, and informed consent                            was obtained. Prior Anticoagulants: The patient has                            taken no previous anticoagulant or antiplatelet                            agents except for aspirin. ASA Grade Assessment:                            III - A patient with severe systemic disease. After                            reviewing the risks and benefits, the patient was                            deemed in satisfactory condition to undergo the  procedure.                           After obtaining informed consent, the colonoscope                            was passed under direct vision. Throughout the                            procedure, the patient's blood pressure, pulse, and                            oxygen saturations were monitored continuously. The                            CF [id number] #[id number] was introduced through the  anus                            and advanced to the 5 cm into the ileum. The                            colonoscopy was performed without difficulty. The                            patient tolerated the procedure. The quality of the                            bowel preparation was good. The terminal ileum,                            ileocecal valve, appendiceal orifice, and rectum                            were photographed. Scope In: 10:59:57 AM Scope Out: 11:13:42 AM Scope Withdrawal Time: 0 hours 8 minutes 56 seconds  Total Procedure Duration: 0 hours 13 minutes 45 seconds  Findings:                 The digital rectal exam findings include                            hemorrhoids. Pertinent negatives include no                            palpable rectal lesions.                           The terminal ileum and ileocecal valve appeared                            normal.                           A 5 mm polyp was found in the ascending colon. The  polyp was sessile. The polyp was removed with a                            cold snare. Resection and retrieval were complete.                           Normal mucosa was found in the entire colon                            otherwise.                           Non-bleeding non-thrombosed external and internal                            hemorrhoids were found during retroflexion, during                            perianal exam and during digital exam. The                            hemorrhoids were Grade II (internal hemorrhoids                            that prolapse but reduce spontaneously). Complications:            No immediate complications. Estimated Blood Loss:     Estimated blood loss was minimal. Impression:               - Hemorrhoids found on digital rectal exam.                           - The examined portion of the ileum was normal.                           - One 5 mm polyp in the ascending colon,  removed                            with a cold snare. Resected and retrieved.                           - Normal mucosa in the entire examined colon                            otherwise                           - Non-bleeding non-thrombosed external and internal                            hemorrhoids. Recommendation:           - The patient will be observed post-procedure,                            until all discharge criteria are met.                           -  Discharge patient to home.                           - Patient has a contact number available for                            emergencies. The signs and symptoms of potential                            delayed complications were discussed with the                            patient. Return to normal activities tomorrow.                            Written discharge instructions were provided to the                            patient.                           - High fiber diet.                           - Use FiberCon 1-2 tablets PO daily.                           - Continue present medications.                           - Await pathology results.                           - Repeat colonoscopy in 07/20/08 years for                            surveillance based on pathology results.                           - The findings and recommendations were discussed                            with the patient.                           - The findings and recommendations were discussed                            with the patient's family. Justice Britain, MD 04/29/2021 11:17:21 AM
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
## 2021-04-29 NOTE — Patient Instructions (Signed)
Handouts given for polyps, and high fiber diet.  Use FiberCon 1-2 tablets orally daily.  Await pathology results.    YOU HAD AN ENDOSCOPIC PROCEDURE TODAY AT [facility]:   Refer to the procedure report that was given to you for any specific questions about what was found during the examination.  If the procedure report does not answer your questions, please call your gastroenterologist to clarify.  If you requested that your care partner not be given the details of your procedure findings, then the procedure report has been included in a sealed envelope for you to review at your convenience later.  YOU SHOULD EXPECT: Some feelings of bloating in the abdomen. Passage of more gas than usual.  Walking can help get rid of the air that was put into your GI tract during the procedure and reduce the bloating. If you had a lower endoscopy (such as a colonoscopy or flexible sigmoidoscopy) you may notice spotting of blood in your stool or on the toilet paper. If you underwent a bowel prep for your procedure, you may not have a normal bowel movement for a few days.  Please Note:  You might notice some irritation and congestion in your nose or some drainage.  This is from the oxygen used during your procedure.  There is no need for concern and it should clear up in a day or so.  SYMPTOMS TO REPORT IMMEDIATELY:  Following lower endoscopy (colonoscopy or flexible sigmoidoscopy):  Excessive amounts of blood in the stool  Significant tenderness or worsening of abdominal pains  Swelling of the abdomen that is new, acute  Fever of 100F or higher  For urgent or emergent issues, a gastroenterologist can be reached at any hour by calling [phone]. Do not use MyChart messaging for urgent concerns.    DIET:  We do recommend a small meal at first, but then you may proceed to your regular diet.  Drink plenty of fluids but you should avoid alcoholic beverages for 24 hours.  ACTIVITY:  You should  plan to take it easy for the rest of today and you should NOT DRIVE or use heavy machinery until tomorrow (because of the sedation medicines used during the test).    FOLLOW UP: Our staff will call the number listed on your records 48-72 hours following your procedure to check on you and address any questions or concerns that you may have regarding the information given to you following your procedure. If we do not reach you, we will leave a message.  We will attempt to reach you two times.  During this call, we will ask if you have developed any symptoms of COVID 19. If you develop any symptoms (ie: fever, flu-like symptoms, shortness of breath, cough etc.) before then, please call [phone].  If you test positive for Covid 19 in the 2 weeks post procedure, please call and report this information to [place].    If any biopsies were taken you will be contacted by phone or by letter within the next 1-3 weeks.  Please call us at [phone] if you have not heard about the biopsies in 3 weeks.    SIGNATURES/CONFIDENTIALITY: You and/or your care partner have signed paperwork which will be entered into your electronic medical record.  These signatures attest to the fact that that the information above on your After Visit Summary has been reviewed and is understood.  Full responsibility of the confidentiality of this discharge information lies with you and/or  your care-partner.

## 2021-05-01 ENCOUNTER — Telehealth: Payer: Self-pay | Admitting: *Deleted

## 2021-05-01 NOTE — Telephone Encounter (Signed)
°  Follow up Call-  Call back number 04/29/2021  Post procedure Call Back phone  # [phone]  Permission to leave phone message Yes  Some recent data might be hidden     Patient questions:  Do you have a fever, pain , or abdominal swelling? No. Pain Score  0 *  Have you tolerated food without any problems? yes  Have you been able to return to your normal activities? Yes.    Do you have any questions about your discharge instructions: Diet   No. Medications  No. Follow up visit  No.  Do you have questions or concerns about your Care? No.  Actions: * If pain score is 4 or above: No action needed, pain <4.

## 2021-05-01 NOTE — Telephone Encounter (Signed)
Poor connection for post procedure follow up call. Will try to call back later today.

## 2021-05-02 ENCOUNTER — Encounter: Payer: Self-pay | Admitting: Gastroenterology

## 2021-05-13 ENCOUNTER — Other Ambulatory Visit: Payer: Self-pay

## 2021-05-13 ENCOUNTER — Encounter: Payer: Self-pay | Admitting: Internal Medicine

## 2021-05-13 ENCOUNTER — Ambulatory Visit (INDEPENDENT_AMBULATORY_CARE_PROVIDER_SITE_OTHER): Payer: Managed Care, Other (non HMO) | Admitting: Internal Medicine

## 2021-05-13 VITALS — BP 116/74 | HR 73 | Temp 98.1°F | Ht 75.0 in | Wt 213.0 lb

## 2021-05-13 DIAGNOSIS — E7800 Pure hypercholesterolemia, unspecified: Secondary | ICD-10-CM

## 2021-05-13 DIAGNOSIS — E1142 Type 2 diabetes mellitus with diabetic polyneuropathy: Secondary | ICD-10-CM | POA: Diagnosis not present

## 2021-05-13 DIAGNOSIS — Z1159 Encounter for screening for other viral diseases: Secondary | ICD-10-CM

## 2021-05-13 DIAGNOSIS — F17200 Nicotine dependence, unspecified, uncomplicated: Secondary | ICD-10-CM | POA: Diagnosis not present

## 2021-05-13 DIAGNOSIS — E559 Vitamin D deficiency, unspecified: Secondary | ICD-10-CM

## 2021-05-13 DIAGNOSIS — Z0001 Encounter for general adult medical examination with abnormal findings: Secondary | ICD-10-CM

## 2021-05-13 DIAGNOSIS — E538 Deficiency of other specified B group vitamins: Secondary | ICD-10-CM

## 2021-05-13 DIAGNOSIS — Z794 Long term (current) use of insulin: Secondary | ICD-10-CM

## 2021-05-13 LAB — URINALYSIS, ROUTINE W REFLEX MICROSCOPIC
Bilirubin Urine: NEGATIVE
Hgb urine dipstick: NEGATIVE
Ketones, ur: NEGATIVE
Leukocytes,Ua: NEGATIVE
Nitrite: NEGATIVE
RBC / HPF: NONE SEEN (ref 0–?)
Specific Gravity, Urine: 1.02 (ref 1.000–1.030)
Total Protein, Urine: NEGATIVE
Urine Glucose: >=1000 — AB
Urobilinogen, UA: 0.2 (ref 0.0–1.0)
WBC, UA: NONE SEEN (ref 0–?)
pH: 6 (ref 5.0–8.0)

## 2021-05-13 LAB — MICROALBUMIN / CREATININE URINE RATIO
Creatinine,U: 77.4 mg/dL
Microalb Creat Ratio: 4.2 mg/g (ref 0.0–30.0)
Microalb, Ur: 3.2 mg/dL — ABNORMAL HIGH (ref 0.0–1.9)

## 2021-05-13 LAB — LIPID PANEL
Cholesterol: 149 mg/dL (ref 0–200)
HDL: 42.9 mg/dL (ref >39.00)
LDL Cholesterol: 79 mg/dL (ref 0–99)
NonHDL: 106.55
Total CHOL/HDL Ratio: 3
Triglycerides: 138 mg/dL (ref 0.0–149.0)
VLDL: 27.6 mg/dL (ref 0.0–40.0)

## 2021-05-13 LAB — PSA: PSA: 0.14 ng/mL (ref 0.10–4.00)

## 2021-05-13 LAB — CBC WITH DIFFERENTIAL/PLATELET
Basophils Absolute: 0 10*3/uL (ref 0.0–0.1)
Basophils Relative: 0.4 % (ref 0.0–3.0)
Eosinophils Absolute: 0.3 10*3/uL (ref 0.0–0.7)
Eosinophils Relative: 3.6 % (ref 0.0–5.0)
HCT: 40.8 % (ref 39.0–52.0)
Hemoglobin: 13.7 g/dL (ref 13.0–17.0)
Lymphocytes Relative: 28.9 % (ref 12.0–46.0)
Lymphs Abs: 2.4 10*3/uL (ref 0.7–4.0)
MCHC: 33.5 g/dL (ref 30.0–36.0)
MCV: 87.6 fl (ref 78.0–100.0)
Monocytes Absolute: 0.4 10*3/uL (ref 0.1–1.0)
Monocytes Relative: 4.5 % (ref 3.0–12.0)
Neutro Abs: 5.1 10*3/uL (ref 1.4–7.7)
Neutrophils Relative %: 62.6 % (ref 43.0–77.0)
Platelets: 210 10*3/uL (ref 150.0–400.0)
RBC: 4.66 Mil/uL (ref 4.22–5.81)
RDW: 14.2 % (ref 11.5–15.5)
WBC: 8.2 10*3/uL (ref 4.0–10.5)

## 2021-05-13 LAB — BASIC METABOLIC PANEL
BUN: 14 mg/dL (ref 6–23)
CO2: 28 mEq/L (ref 19–32)
Calcium: 9.3 mg/dL (ref 8.4–10.5)
Chloride: 103 mEq/L (ref 96–112)
Creatinine, Ser: 0.96 mg/dL (ref 0.40–1.50)
GFR: 93.62 mL/min (ref >60.00)
Glucose, Bld: 312 mg/dL — ABNORMAL HIGH (ref 70–99)
Potassium: 4.3 mEq/L (ref 3.5–5.1)
Sodium: 139 mEq/L (ref 135–145)

## 2021-05-13 LAB — HEPATIC FUNCTION PANEL
ALT: 21 U/L (ref 0–53)
AST: 17 U/L (ref 0–37)
Albumin: 4 g/dL (ref 3.5–5.2)
Alkaline Phosphatase: 102 U/L (ref 39–117)
Bilirubin, Direct: 0.1 mg/dL (ref 0.0–0.3)
Total Bilirubin: 0.5 mg/dL (ref 0.2–1.2)
Total Protein: 6.8 g/dL (ref 6.0–8.3)

## 2021-05-13 LAB — TSH: TSH: 1.23 u[IU]/mL (ref 0.35–5.50)

## 2021-05-13 LAB — VITAMIN B12: Vitamin B-12: 530 pg/mL (ref 211–911)

## 2021-05-13 LAB — HEMOGLOBIN A1C: Hgb A1c MFr Bld: 13 % — ABNORMAL HIGH (ref 4.6–6.5)

## 2021-05-13 LAB — VITAMIN D 25 HYDROXY (VIT D DEFICIENCY, FRACTURES): VITD: 24.61 ng/mL — ABNORMAL LOW (ref 30.00–100.00)

## 2021-05-13 MED ORDER — TOUJEO SOLOSTAR 300 UNIT/ML ~~LOC~~ SOPN: 30.0000 [IU] | PEN_INJECTOR | Freq: Every day | SUBCUTANEOUS | 3 refills | Status: DC

## 2021-05-13 MED ORDER — INSULIN SYRINGES (DISPOSABLE) U-100 1 ML MISC: 3 refills | Status: DC

## 2021-05-13 MED ORDER — BD LANCET ULTRAFINE 30G MISC: 3 refills | Status: AC

## 2021-05-13 MED ORDER — ROSUVASTATIN CALCIUM 40 MG PO TABS
40.0000 mg | ORAL_TABLET | Freq: Every day | ORAL | 3 refills | Status: DC
Start: 2021-05-13 — End: 2022-10-14

## 2021-05-13 MED ORDER — INSULIN REGULAR HUMAN 100 UNIT/ML IJ SOLN: 10.0000 [IU] | Freq: Three times a day (TID) | INTRAMUSCULAR | 1 refills | Status: DC

## 2021-05-13 MED ORDER — RELION PEN NEEDLES 32G X 4 MM MISC: 3 refills | Status: DC

## 2021-05-13 NOTE — Assessment & Plan Note (Signed)
Last vitamin D Lab Results  Component Value Date   VD25OH 18.77 (L) 11/11/2020   Low, to start oral replacement

## 2021-05-13 NOTE — Patient Instructions (Signed)
Please take OTC Vitamin D3 at 2000 units per day, indefinitely  You will be contacted regarding the referral for: Diabetic education, and Endocrinology  Please continue all other medications as before, and refills have been done if requested.  Please have the pharmacy call with any other refills you may need.  Please continue your efforts at being more active, low cholesterol diet, and weight control.  You are otherwise up to date with prevention measures today.  Please keep your appointments with your specialists as you may have planned  Please go to the LAB at the blood drawing area for the tests to be done  You will be contacted by phone if any changes need to be made immediately.  Otherwise, you will receive a letter about your results with an explanation, but please check with MyChart first.  Please remember to sign up for MyChart if you have not done so, as this will be important to you in the future with finding out test results, communicating by private email, and scheduling acute appointments online when needed.  Please make an Appointment to return in 6 months, or sooner if needed
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
## 2021-05-14 LAB — HEPATITIS C ANTIBODY
Hepatitis C Ab: NONREACTIVE
SIGNAL TO CUT-OFF: 0.04 (ref <1.00)

## 2021-05-15 ENCOUNTER — Encounter: Payer: Self-pay | Admitting: Internal Medicine

## 2021-05-15 DIAGNOSIS — E785 Hyperlipidemia, unspecified: Secondary | ICD-10-CM | POA: Insufficient documentation

## 2021-05-15 NOTE — Assessment & Plan Note (Signed)
Pt counsled to quit, pt not ready °

## 2021-05-15 NOTE — Assessment & Plan Note (Signed)
Lab Results  ?Component Value Date  ? Barry Phillips 81 05/13/2021  ? ?Stable, pt to continue current statin crestor ? ?

## 2021-05-15 NOTE — Assessment & Plan Note (Signed)
Lab Results  ?Component Value Date  ? HGBA1C 13.0 (H) 05/13/2021  ? ?Severe uncontrolled, pt to take the current medical treatment toujeo, novolog, refer endo and DM education ? ?

## 2021-05-15 NOTE — Assessment & Plan Note (Signed)
Age and sex appropriate education and counseling updated with regular exercise and diet ?Referrals for preventative services - pt due to eye exam ?Immunizations addressed - declines flu shot ?Smoking counseling  - counsled to quit, pt not ready ?Evidence for depression or other mood disorder - none significant ?Most recent labs reviewed. ?I have personally reviewed and have noted: ?1) the patient's medical and social history ?2) The patient's current medications and supplements ?3) The patient's height, weight, and BMI have been recorded in the chart ? ?

## 2021-05-20 ENCOUNTER — Other Ambulatory Visit: Payer: Self-pay | Admitting: Internal Medicine

## 2021-05-20 NOTE — Telephone Encounter (Signed)
Please refill as per office routine med refill policy (all routine meds to be refilled for 3 mo or monthly (per pt preference) up to one year from last visit, then month to month grace period for 3 mo, then further med refills will have to be denied) ? ?

## 2021-05-21 NOTE — Telephone Encounter (Signed)
Please refill as per office routine med refill policy (all routine meds to be refilled for 3 mo or monthly (per pt preference) up to one year from last visit, then month to month grace period for 3 mo, then further med refills will have to be denied) ? ?

## 2021-05-21 NOTE — Telephone Encounter (Signed)
Per patient's chart, given by historical provider. Is patient supposed to take? If so, what are the directions? ?

## 2021-05-27 ENCOUNTER — Encounter (HOSPITAL_COMMUNITY): Payer: Self-pay

## 2021-05-27 ENCOUNTER — Other Ambulatory Visit: Payer: Self-pay

## 2021-05-27 ENCOUNTER — Ambulatory Visit (HOSPITAL_COMMUNITY)
Admission: EM | Admit: 2021-05-27 | Discharge: 2021-05-27 | Disposition: A | Discharge status: Home / Self Care | Payer: Managed Care, Other (non HMO) | Attending: Emergency Medicine | Admitting: Emergency Medicine

## 2021-05-27 DIAGNOSIS — A084 Viral intestinal infection, unspecified: Secondary | ICD-10-CM

## 2021-05-27 MED ORDER — ONDANSETRON 4 MG PO TBDP
4.0000 mg | ORAL_TABLET | Freq: Once | ORAL | Status: AC
  Administered 2021-05-27: 4 mg via ORAL

## 2021-05-27 MED ORDER — ONDANSETRON 4 MG PO TBDP: 4.0000 mg | ORAL_TABLET | Freq: Three times a day (TID) | ORAL | 0 refills | Status: DC | PRN

## 2021-05-27 MED ORDER — ONDANSETRON 4 MG PO TBDP
ORAL_TABLET | ORAL | Status: AC
  Filled 2021-05-27: qty 1

## 2021-05-27 MED ORDER — LOPERAMIDE HCL 2 MG PO CAPS: 2.0000 mg | ORAL_CAPSULE | Freq: Four times a day (QID) | ORAL | 0 refills | Status: DC | PRN

## 2021-05-27 NOTE — Discharge Instructions (Signed)
Your symptoms are most likely caused by a virus, it will work its way out your system over the next few days  You can use zofran every 6 hours as needed for nausea, be mindful this medication may make you drowsy, take the first dose at home to see how it affects your body  You can use Imodium to help with diarrhea, and be mindful over use of this medication may cause opposite effect constipation  You can use over-the-counter ibuprofen or Tylenol, which ever you have at home, to help manage fevers  Continue to promote hydration throughout the day by using electrolyte replacement solution such as Gatorade, body armor, Pedialyte, which ever you have at home  Try eating bland foods such as bread, rice, toast, fruit which are easier on the stomach to digest, avoid foods that are overly spicy, overly seasoned or greasy  
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
## 2021-05-27 NOTE — ED Triage Notes (Signed)
Pt c/o n/v/d since 11am. States his daughter and wife has the same.  ?

## 2021-06-02 NOTE — Telephone Encounter (Signed)
Left message for patient to call me back regarding novolog insulin ?

## 2021-06-20 ENCOUNTER — Ambulatory Visit (HOSPITAL_COMMUNITY)
Admission: EM | Admit: 2021-06-20 | Discharge: 2021-06-20 | Disposition: A | Discharge status: Home / Self Care | Payer: Managed Care, Other (non HMO) | Attending: Nurse Practitioner | Admitting: Nurse Practitioner

## 2021-06-20 ENCOUNTER — Encounter (HOSPITAL_COMMUNITY): Payer: Self-pay

## 2021-06-20 DIAGNOSIS — K0889 Other specified disorders of teeth and supporting structures: Secondary | ICD-10-CM | POA: Diagnosis not present

## 2021-06-20 DIAGNOSIS — R030 Elevated blood-pressure reading, without diagnosis of hypertension: Secondary | ICD-10-CM

## 2021-06-20 DIAGNOSIS — K047 Periapical abscess without sinus: Secondary | ICD-10-CM | POA: Diagnosis not present

## 2021-06-20 MED ORDER — NAPROXEN 500 MG PO TABS: 500.0000 mg | ORAL_TABLET | Freq: Two times a day (BID) | ORAL | 0 refills | Status: DC

## 2021-06-20 MED ORDER — AMOXICILLIN-POT CLAVULANATE 875-125 MG PO TABS: 1.0000 | ORAL_TABLET | Freq: Two times a day (BID) | ORAL | 0 refills | Status: AC

## 2021-06-20 NOTE — ED Triage Notes (Signed)
Pt c/o dental pain for several days. ?
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
## 2021-06-20 NOTE — Discharge Instructions (Signed)
-   Please start the antibiotics for your dental infection and follow up with a Dentist ?-You can continue the oral gel and salt water rinses; you can use naproxen 500 mg twice daily as needed for dental pain ?-If your blood pressure remains elevated at home after your pain is controlled, please follow-up with your primary care provider ?

## 2021-08-18 ENCOUNTER — Encounter: Payer: Self-pay | Admitting: Internal Medicine

## 2021-08-18 ENCOUNTER — Ambulatory Visit (INDEPENDENT_AMBULATORY_CARE_PROVIDER_SITE_OTHER): Payer: Commercial Managed Care - HMO | Admitting: Internal Medicine

## 2021-08-18 VITALS — BP 122/84 | HR 77 | Ht 75.0 in | Wt 201.8 lb

## 2021-08-18 DIAGNOSIS — E1165 Type 2 diabetes mellitus with hyperglycemia: Secondary | ICD-10-CM

## 2021-08-18 DIAGNOSIS — E1142 Type 2 diabetes mellitus with diabetic polyneuropathy: Secondary | ICD-10-CM

## 2021-08-18 DIAGNOSIS — E785 Hyperlipidemia, unspecified: Secondary | ICD-10-CM | POA: Diagnosis not present

## 2021-08-18 DIAGNOSIS — Z794 Long term (current) use of insulin: Secondary | ICD-10-CM | POA: Diagnosis not present

## 2021-08-18 LAB — POCT GLYCOSYLATED HEMOGLOBIN (HGB A1C): Hemoglobin A1C: 13.3 % — AB (ref 4.0–5.6)

## 2021-08-18 MED ORDER — NOVOLOG FLEXPEN 100 UNIT/ML ~~LOC~~ SOPN: PEN_INJECTOR | SUBCUTANEOUS | 6 refills | Status: DC

## 2021-08-18 MED ORDER — BASAGLAR KWIKPEN 100 UNIT/ML ~~LOC~~ SOPN
30.0000 [IU] | PEN_INJECTOR | Freq: Every day | SUBCUTANEOUS | 3 refills | Status: DC
Start: 2021-08-18 — End: 2021-10-03

## 2021-08-18 MED ORDER — DEXCOM G7 SENSOR MISC
1.0000 | 3 refills | Status: DC
Start: 2021-08-18 — End: 2023-04-16

## 2021-08-18 MED ORDER — RELION PEN NEEDLES 32G X 4 MM MISC: 1.0000 | Freq: Four times a day (QID) | 3 refills | Status: DC
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
## 2021-08-18 NOTE — Patient Instructions (Signed)
-   Start Basaglar 30 units once daily  - Novolog 10 units with each meal  -Novolog correctional insulin: ADD extra units on insulin to your meal-time Novolog dose if your blood sugars are higher than 160. Use the scale below to help guide you:   Blood sugar before meal Number of units to inject  Less than 160 0 unit  161 -  190 1 units  191 -  220 2 units  221 -  250 3 units  251 -  280 4 units  281 -  310 5 units  311 -  340 6 units  341 -  370 7 units  371 -  400 8 units    HOW TO TREAT LOW BLOOD SUGARS (Blood sugar LESS THAN 70 MG/DL) Please follow the RULE OF 15 for the treatment of hypoglycemia treatment (when your (blood sugars are less than 70 mg/dL)   STEP 1: Take 15 grams of carbohydrates when your blood sugar is low, which includes:  3-4 GLUCOSE TABS  OR 3-4 OZ OF JUICE OR REGULAR SODA OR ONE TUBE OF GLUCOSE GEL    STEP 2: RECHECK blood sugar in 15 MINUTES STEP 3: If your blood sugar is still low at the 15 minute recheck --> then, go back to STEP 1 and treat AGAIN with another 15 grams of carbohydrates.

## 2021-08-26 ENCOUNTER — Telehealth: Payer: Self-pay | Admitting: Pharmacy Technician

## 2021-08-26 NOTE — Telephone Encounter (Signed)
Received PA request for Novolog Flexpen from pharmacy- Plan prefers Humalog. Please Advise if Md would like to switch.

## 2021-08-27 MED ORDER — INSULIN LISPRO (1 UNIT DIAL) 100 UNIT/ML (KWIKPEN)
PEN_INJECTOR | SUBCUTANEOUS | 3 refills | Status: DC
Start: 2021-08-27 — End: 2021-10-03

## 2021-08-27 NOTE — Telephone Encounter (Signed)
Vm left and my chart message sent to patient.

## 2021-09-11 ENCOUNTER — Other Ambulatory Visit (HOSPITAL_COMMUNITY): Payer: Self-pay

## 2021-09-11 ENCOUNTER — Telehealth: Payer: Self-pay | Admitting: Pharmacy Technician

## 2021-09-11 NOTE — Telephone Encounter (Signed)
Patient Advocate Encounter  Prior Authorization for [employer] has been approved.    PA# PA Case ID: [id number] Effective dates: 08/26/21 through 08/28/22  Per Test Claim RX filled 09/10/21. Receiver is $45

## 2021-09-26 ENCOUNTER — Telehealth: Payer: Self-pay | Admitting: Internal Medicine

## 2021-09-26 DIAGNOSIS — E1142 Type 2 diabetes mellitus with diabetic polyneuropathy: Secondary | ICD-10-CM

## 2021-09-26 NOTE — Telephone Encounter (Signed)
Caller & Relationship to patient: Pharmacist   Call back number: [phone]   Date of last office visit: 05/13/2021   Date of next office visit:  11/18/2021   Medication(s) to be refilled: gabapentin (NEURONTIN) 100 MG capsule    Medication to be sent to [place], [place]

## 2021-09-29 MED ORDER — GABAPENTIN 100 MG PO CAPS: 100.0000 mg | ORAL_CAPSULE | Freq: Three times a day (TID) | ORAL | 1 refills | Status: DC

## 2021-10-03 ENCOUNTER — Telehealth: Payer: Self-pay | Admitting: Internal Medicine

## 2021-10-03 MED ORDER — BASAGLAR KWIKPEN 100 UNIT/ML ~~LOC~~ SOPN: 30.0000 [IU] | PEN_INJECTOR | Freq: Every day | SUBCUTANEOUS | 1 refills | Status: DC

## 2021-10-03 MED ORDER — INSULIN LISPRO (1 UNIT DIAL) 100 UNIT/ML (KWIKPEN): PEN_INJECTOR | SUBCUTANEOUS | 1 refills | Status: DC

## 2021-10-03 NOTE — Telephone Encounter (Signed)
[facility] is calling to get an electronic scripts (e-script) for Basaglar KwikPen Insulin Glargine (BASAGLAR KWIKPEN) 100 UNIT/ML  And  insulin lispro insulin lispro (HUMALOG KWIKPEN) 100 UNIT/ML KwikPen

## 2021-10-03 NOTE — Telephone Encounter (Signed)
Done

## 2021-11-12 ENCOUNTER — Ambulatory Visit: Payer: 59 | Admitting: Internal Medicine

## 2021-11-18 ENCOUNTER — Ambulatory Visit (INDEPENDENT_AMBULATORY_CARE_PROVIDER_SITE_OTHER): Payer: Commercial Managed Care - HMO | Admitting: Internal Medicine

## 2021-11-18 VITALS — BP 120/80 | HR 85 | Temp 98.6°F | Ht 75.0 in | Wt 199.0 lb

## 2021-11-18 DIAGNOSIS — Z794 Long term (current) use of insulin: Secondary | ICD-10-CM

## 2021-11-18 DIAGNOSIS — E1142 Type 2 diabetes mellitus with diabetic polyneuropathy: Secondary | ICD-10-CM

## 2021-11-18 DIAGNOSIS — E559 Vitamin D deficiency, unspecified: Secondary | ICD-10-CM

## 2021-11-18 DIAGNOSIS — E538 Deficiency of other specified B group vitamins: Secondary | ICD-10-CM | POA: Diagnosis not present

## 2021-11-18 DIAGNOSIS — F17200 Nicotine dependence, unspecified, uncomplicated: Secondary | ICD-10-CM

## 2021-11-18 DIAGNOSIS — R5383 Other fatigue: Secondary | ICD-10-CM | POA: Diagnosis not present

## 2021-11-18 DIAGNOSIS — E7800 Pure hypercholesterolemia, unspecified: Secondary | ICD-10-CM

## 2021-11-18 LAB — HEPATIC FUNCTION PANEL
ALT: 27 U/L (ref 0–53)
AST: 22 U/L (ref 0–37)
Albumin: 4.2 g/dL (ref 3.5–5.2)
Alkaline Phosphatase: 92 U/L (ref 39–117)
Bilirubin, Direct: 0.1 mg/dL (ref 0.0–0.3)
Total Bilirubin: 0.5 mg/dL (ref 0.2–1.2)
Total Protein: 7.8 g/dL (ref 6.0–8.3)

## 2021-11-18 LAB — BASIC METABOLIC PANEL
BUN: 27 mg/dL — ABNORMAL HIGH (ref 6–23)
CO2: 27 mEq/L (ref 19–32)
Calcium: 9.5 mg/dL (ref 8.4–10.5)
Chloride: 101 mEq/L (ref 96–112)
Creatinine, Ser: 1.14 mg/dL (ref 0.40–1.50)
GFR: 75.9 mL/min (ref >60.00)
Glucose, Bld: 282 mg/dL — ABNORMAL HIGH (ref 70–99)
Potassium: 4.4 mEq/L (ref 3.5–5.1)
Sodium: 136 mEq/L (ref 135–145)

## 2021-11-18 LAB — LIPID PANEL
Cholesterol: 119 mg/dL (ref 0–200)
HDL: 39.6 mg/dL (ref >39.00)
LDL Cholesterol: 61 mg/dL (ref 0–99)
NonHDL: 79.58
Total CHOL/HDL Ratio: 3
Triglycerides: 93 mg/dL (ref 0.0–149.0)
VLDL: 18.6 mg/dL (ref 0.0–40.0)

## 2021-11-18 LAB — HEMOGLOBIN A1C: Hgb A1c MFr Bld: 11.5 % — ABNORMAL HIGH (ref 4.6–6.5)

## 2021-11-18 LAB — TESTOSTERONE: Testosterone: 283.71 ng/dL — ABNORMAL LOW (ref 300.00–890.00)

## 2021-11-18 LAB — VITAMIN D 25 HYDROXY (VIT D DEFICIENCY, FRACTURES): VITD: 28.04 ng/mL — ABNORMAL LOW (ref 30.00–100.00)

## 2021-11-18 LAB — VITAMIN B12: Vitamin B-12: 471 pg/mL (ref 211–911)

## 2021-11-18 NOTE — Assessment & Plan Note (Signed)
Last vitamin D Lab Results  Component Value Date   VD25OH 24.61 (L) 05/13/2021   Low , to start oral replacement

## 2021-11-18 NOTE — Patient Instructions (Addendum)
Please take OTC Vitamin D3 at 2000 units per day, indefinitely  Please continue all other medications as before, and refills have been done if requested.  Please have the pharmacy call with any other refills you may need.  Please continue your efforts at being more active, low cholesterol diet, and weight control.  Please keep your appointments with your specialists as you may have planned  You will be contacted regarding the referral for: Diabetes Education, and Eye doctor (ophthalmology)  Please go to the LAB at the blood drawing area for the tests to be done  You will be contacted by phone if any changes need to be made immediately.  Otherwise, you will receive a letter about your results with an explanation, but please check with MyChart first.  Please remember to sign up for MyChart if you have not done so, as this will be important to you in the future with finding out test results, communicating by private email, and scheduling acute appointments online when needed.  Please make an Appointment to return in 6 months, or sooner if needed
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
## 2021-11-19 ENCOUNTER — Encounter: Payer: Self-pay | Admitting: Internal Medicine

## 2021-11-19 DIAGNOSIS — R5383 Other fatigue: Secondary | ICD-10-CM | POA: Insufficient documentation

## 2021-11-19 NOTE — Assessment & Plan Note (Signed)
Lab Results  Component Value Date   LDLCALC 61 11/18/2021   Stable, pt to continue current statin crestor 40 mg qd

## 2021-11-19 NOTE — Assessment & Plan Note (Signed)
Almost quit, pt declines chantix for now

## 2021-11-19 NOTE — Assessment & Plan Note (Signed)
Also for testosterone level ,  to f/u any worsening symptoms or concerns

## 2021-11-19 NOTE — Assessment & Plan Note (Signed)
Lab Results  Component Value Date   HGBA1C 11.5 (H) 11/18/2021   Severe uncontrolled, , pt to continue current medical treatment basaglar 30u qd, humalog 50 units max as delcines change for now, but refer DM education, and ophthalmology yearly exam

## 2021-11-20 ENCOUNTER — Telehealth: Payer: Self-pay

## 2021-11-20 MED ORDER — TESTOSTERONE CYPIONATE 200 MG/ML IM SOLN: 200.0000 mg | INTRAMUSCULAR | 1 refills | Status: DC

## 2021-11-20 NOTE — Telephone Encounter (Signed)
See below

## 2021-11-20 NOTE — Telephone Encounter (Signed)
Received patient message and he would like to start the testosterone injections.

## 2021-11-20 NOTE — Telephone Encounter (Signed)
Ok this is done 

## 2021-12-01 NOTE — Telephone Encounter (Signed)
Unable to contact patient, patient message sent

## 2021-12-24 ENCOUNTER — Encounter: Payer: Self-pay | Admitting: Internal Medicine

## 2021-12-24 ENCOUNTER — Ambulatory Visit (INDEPENDENT_AMBULATORY_CARE_PROVIDER_SITE_OTHER): Payer: Commercial Managed Care - HMO | Admitting: Internal Medicine

## 2021-12-24 VITALS — BP 140/90 | HR 70 | Ht 75.0 in | Wt 210.0 lb

## 2021-12-24 DIAGNOSIS — E1142 Type 2 diabetes mellitus with diabetic polyneuropathy: Secondary | ICD-10-CM

## 2021-12-24 DIAGNOSIS — E1165 Type 2 diabetes mellitus with hyperglycemia: Secondary | ICD-10-CM | POA: Diagnosis not present

## 2021-12-24 DIAGNOSIS — Z794 Long term (current) use of insulin: Secondary | ICD-10-CM | POA: Diagnosis not present

## 2021-12-24 DIAGNOSIS — E785 Hyperlipidemia, unspecified: Secondary | ICD-10-CM | POA: Diagnosis not present

## 2021-12-24 LAB — POCT GLUCOSE (DEVICE FOR HOME USE): POC Glucose: 300 mg/dl — AB (ref 70–99)

## 2021-12-24 MED ORDER — INSULIN PEN NEEDLE 32G X 4 MM MISC: 1.0000 | Freq: Four times a day (QID) | 3 refills | Status: DC

## 2021-12-24 MED ORDER — DAPAGLIFLOZIN PROPANEDIOL 5 MG PO TABS: 5.0000 mg | ORAL_TABLET | Freq: Every day | ORAL | 3 refills | Status: DC

## 2021-12-24 MED ORDER — INSULIN LISPRO (1 UNIT DIAL) 100 UNIT/ML (KWIKPEN): PEN_INJECTOR | SUBCUTANEOUS | 3 refills | Status: DC

## 2021-12-24 MED ORDER — BASAGLAR KWIKPEN 100 UNIT/ML ~~LOC~~ SOPN: 20.0000 [IU] | PEN_INJECTOR | Freq: Every day | SUBCUTANEOUS | 3 refills | Status: DC
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
## 2021-12-24 NOTE — Patient Instructions (Addendum)
-   Start Farxiga 5 mg daily  - Decrease Basaglar 20 units once daily  - Continue Humalog 5 units with each meal  - Humalog correctional insulin: ADD extra units on insulin to your meal-time Novolog dose if your blood sugars are higher than 160. Use the scale below to help guide you:   Blood sugar before meal Number of units to inject  Less than 160 0 unit  161 -  190 1 units  191 -  220 2 units  221 -  250 3 units  251 -  280 4 units  281 -  310 5 units  311 -  340 6 units  341 -  370 7 units  371 -  400 8 units    HOW TO TREAT LOW BLOOD SUGARS (Blood sugar LESS THAN 70 MG/DL) Please follow the RULE OF 15 for the treatment of hypoglycemia treatment (when your (blood sugars are less than 70 mg/dL)   STEP 1: Take 15 grams of carbohydrates when your blood sugar is low, which includes:  3-4 GLUCOSE TABS  OR 3-4 OZ OF JUICE OR REGULAR SODA OR ONE TUBE OF GLUCOSE GEL    STEP 2: RECHECK blood sugar in 15 MINUTES STEP 3: If your blood sugar is still low at the 15 minute recheck --> then, go back to STEP 1 and treat AGAIN with another 15 grams of carbohydrates.

## 2021-12-26 ENCOUNTER — Other Ambulatory Visit (HOSPITAL_COMMUNITY): Payer: Self-pay

## 2021-12-26 ENCOUNTER — Telehealth: Payer: Self-pay

## 2021-12-26 NOTE — Telephone Encounter (Signed)
Patient Advocate Encounter  Received Notification in [facility] of prior authorization for Insulin Lispro (1 Unit Dial) 100UNIT/ML pen-injectors  Per Test Claim: Humalog Kwikpen 100 UNIT/ML KwikPen (Insulin Lispro) was last filled 11/03/21 via mail. Next refill 02/08/22. PA is not needed

## 2022-01-05 ENCOUNTER — Ambulatory Visit: Payer: Commercial Managed Care - HMO | Admitting: Registered"

## 2022-02-16 ENCOUNTER — Emergency Department (HOSPITAL_COMMUNITY): Payer: Commercial Managed Care - HMO

## 2022-02-16 ENCOUNTER — Other Ambulatory Visit: Payer: Self-pay

## 2022-02-16 ENCOUNTER — Emergency Department (HOSPITAL_COMMUNITY)
Admission: EM | Admit: 2022-02-16 | Discharge: 2022-02-17 | Discharge status: Against Medical Advice | Payer: Commercial Managed Care - HMO | Attending: Physician Assistant | Admitting: Physician Assistant

## 2022-02-16 DIAGNOSIS — R079 Chest pain, unspecified: Secondary | ICD-10-CM | POA: Diagnosis not present

## 2022-02-16 DIAGNOSIS — Z5321 Procedure and treatment not carried out due to patient leaving prior to being seen by health care provider: Secondary | ICD-10-CM | POA: Insufficient documentation

## 2022-02-16 DIAGNOSIS — M542 Cervicalgia: Secondary | ICD-10-CM | POA: Insufficient documentation

## 2022-02-16 DIAGNOSIS — M79602 Pain in left arm: Secondary | ICD-10-CM | POA: Insufficient documentation

## 2022-02-16 LAB — CBC WITH DIFFERENTIAL/PLATELET
Abs Immature Granulocytes: 0.02 10*3/uL (ref 0.00–0.07)
Basophils Absolute: 0 10*3/uL (ref 0.0–0.1)
Basophils Relative: 0 %
Eosinophils Absolute: 0.2 10*3/uL (ref 0.0–0.5)
Eosinophils Relative: 3 %
HCT: 40.7 % (ref 39.0–52.0)
Hemoglobin: 13.2 g/dL (ref 13.0–17.0)
Immature Granulocytes: 0 %
Lymphocytes Relative: 28 %
Lymphs Abs: 2.1 10*3/uL (ref 0.7–4.0)
MCH: 29.7 pg (ref 26.0–34.0)
MCHC: 32.4 g/dL (ref 30.0–36.0)
MCV: 91.5 fL (ref 80.0–100.0)
Monocytes Absolute: 0.4 10*3/uL (ref 0.1–1.0)
Monocytes Relative: 5 %
Neutro Abs: 4.7 10*3/uL (ref 1.7–7.7)
Neutrophils Relative %: 64 %
Platelets: 209 10*3/uL (ref 150–400)
RBC: 4.45 MIL/uL (ref 4.22–5.81)
RDW: 12.8 % (ref 11.5–15.5)
WBC: 7.5 10*3/uL (ref 4.0–10.5)
nRBC: 0 % (ref 0.0–0.2)

## 2022-02-16 LAB — BASIC METABOLIC PANEL
Anion gap: 7 (ref 5–15)
BUN: 16 mg/dL (ref 6–20)
CO2: 26 mmol/L (ref 22–32)
Calcium: 9 mg/dL (ref 8.9–10.3)
Chloride: 105 mmol/L (ref 98–111)
Creatinine, Ser: 0.9 mg/dL (ref 0.61–1.24)
GFR, Estimated: >60 mL/min (ref >60)
Glucose, Bld: 360 mg/dL — ABNORMAL HIGH (ref 70–99)
Potassium: 4.4 mmol/L (ref 3.5–5.1)
Sodium: 138 mmol/L (ref 135–145)

## 2022-02-16 LAB — TROPONIN I (HIGH SENSITIVITY): Troponin I (High Sensitivity): 2 ng/L (ref <18)

## 2022-02-16 NOTE — ED Triage Notes (Signed)
Pt reports chest pressure, neck pain, and left arm numbness and tingling.

## 2022-02-16 NOTE — ED Provider Triage Note (Signed)
Emergency Medicine Provider Triage Evaluation Note  [name] , a 51 y.o. male  was evaluated in triage.  Pt complains of pain to left arm. Pain x 2 weeks. Occasionally will et pain in left chest and into neck however pain is to posterior neck. Initially thought he slept wrong. No swelling to UE. Occasionally will have tingling to left arm.  Review of Systems  Positive: Left arm pain, CP, neck pain Negative: Fever, cough  Physical Exam  BP (!) 120/97   Pulse 77   Temp 98.2 F (36.8 C)   Resp 13   SpO2 100%  Gen:   Awake, no distress   Resp:  Normal effort  MSK:   Moves extremities without difficulty  Other:    Medical Decision Making  Medically screening exam initiated at 7:04 PM.  Appropriate orders placed.  Barry Phillips was informed that the remainder of the evaluation will be completed by another provider, this initial triage assessment does not replace that evaluation, and the importance of remaining in the ED until their evaluation is complete.  CP, left arm pain   Barry Phillips A, PA-C 02/16/22 1905

## 2022-02-17 ENCOUNTER — Telehealth: Payer: Self-pay

## 2022-02-17 NOTE — Telephone Encounter (Signed)
Transition Care Management Unsuccessful Follow-up Telephone Call  Date of discharge and from where:  Barry Phillips on 02/17/2022  Dx: Chest Pain; AMA  Attempts:  1st Attempt  Reason for unsuccessful TCM follow-up call:  Left voice message

## 2022-02-19 ENCOUNTER — Telehealth: Payer: Self-pay

## 2022-02-19 NOTE — Telephone Encounter (Signed)
Patient called back, can be reached at [phone].

## 2022-02-19 NOTE — Telephone Encounter (Signed)
Transition Care Management Unsuccessful Follow-up Telephone Call  Date of discharge and from where:  02/17/22-[facility]  Attempts:  2nd Attempt  Reason for unsuccessful TCM follow-up call:  Left voice message

## 2022-05-19 ENCOUNTER — Ambulatory Visit: Payer: Commercial Managed Care - HMO | Admitting: Internal Medicine

## 2022-07-03 ENCOUNTER — Ambulatory Visit: Payer: Commercial Managed Care - HMO | Admitting: Internal Medicine
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
## 2022-10-14 ENCOUNTER — Encounter: Payer: Self-pay | Admitting: Internal Medicine

## 2022-10-14 ENCOUNTER — Ambulatory Visit (INDEPENDENT_AMBULATORY_CARE_PROVIDER_SITE_OTHER): Payer: Self-pay | Admitting: Internal Medicine

## 2022-10-14 ENCOUNTER — Ambulatory Visit: Payer: Commercial Managed Care - HMO | Admitting: Internal Medicine

## 2022-10-14 VITALS — BP 118/70 | HR 76 | Temp 97.9°F | Ht 75.0 in | Wt 205.0 lb

## 2022-10-14 DIAGNOSIS — E1142 Type 2 diabetes mellitus with diabetic polyneuropathy: Secondary | ICD-10-CM

## 2022-10-14 DIAGNOSIS — Z794 Long term (current) use of insulin: Secondary | ICD-10-CM

## 2022-10-14 DIAGNOSIS — M2141 Flat foot [pes planus] (acquired), right foot: Secondary | ICD-10-CM

## 2022-10-14 DIAGNOSIS — G629 Polyneuropathy, unspecified: Secondary | ICD-10-CM

## 2022-10-14 DIAGNOSIS — M2142 Flat foot [pes planus] (acquired), left foot: Secondary | ICD-10-CM

## 2022-10-14 MED ORDER — GABAPENTIN 100 MG PO CAPS: 100.0000 mg | ORAL_CAPSULE | Freq: Three times a day (TID) | ORAL | 1 refills | Status: DC

## 2022-10-14 MED ORDER — ROSUVASTATIN CALCIUM 40 MG PO TABS: 40.0000 mg | ORAL_TABLET | Freq: Every day | ORAL | 3 refills | Status: DC

## 2022-10-14 MED ORDER — INSULIN PEN NEEDLE 32G X 4 MM MISC: 1.0000 | Freq: Four times a day (QID) | 3 refills | Status: DC

## 2022-10-14 MED ORDER — BASAGLAR KWIKPEN 100 UNIT/ML ~~LOC~~ SOPN: 20.0000 [IU] | PEN_INJECTOR | Freq: Every day | SUBCUTANEOUS | 3 refills | Status: DC

## 2022-10-14 MED ORDER — INSULIN LISPRO (1 UNIT DIAL) 100 UNIT/ML (KWIKPEN): PEN_INJECTOR | SUBCUTANEOUS | 3 refills | Status: DC
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
## 2022-10-14 NOTE — Patient Instructions (Signed)
Your A1c was done today  Ok to restart and take all medications as prescribed  This includes the gabapentin at the lower dose, but call or Mychart Message in 1 month if you need the higher dosing  Please let us know if you need anything further straightened out by Mychart Messaging  Please continue all other medications as before, and refills have been done if requested.  Please have the pharmacy call with any other refills you may need.  Please continue your efforts at being more active, low cholesterol diet, and weight control.  You are otherwise up to date with prevention measures today.  Please keep your appointments with your specialists as you may have planned  Please return for more evalaution and treatment when you have the insurance back in effect, or 6 months or sooner if needed

## 2022-10-16 ENCOUNTER — Encounter: Payer: Self-pay | Admitting: Internal Medicine

## 2022-10-16 NOTE — Assessment & Plan Note (Signed)
Lab Results  Component Value Date   HGBA1C 11.5 (H) 11/18/2021   Uncontrolled,, pt to restart current medical treatment

## 2022-10-16 NOTE — Assessment & Plan Note (Signed)
With instep pain, declines podaitry or sport med for now

## 2022-10-16 NOTE — Assessment & Plan Note (Signed)
Worse recently to feet - for restart gabapentin asd,

## 2023-04-16 ENCOUNTER — Ambulatory Visit (INDEPENDENT_AMBULATORY_CARE_PROVIDER_SITE_OTHER): Payer: BLUE CROSS/BLUE SHIELD | Admitting: Internal Medicine

## 2023-04-16 ENCOUNTER — Encounter: Payer: Self-pay | Admitting: Internal Medicine

## 2023-04-16 VITALS — BP 124/82 | HR 76 | Temp 97.9°F | Ht 75.0 in | Wt 204.0 lb

## 2023-04-16 DIAGNOSIS — N529 Male erectile dysfunction, unspecified: Secondary | ICD-10-CM | POA: Diagnosis not present

## 2023-04-16 DIAGNOSIS — Z125 Encounter for screening for malignant neoplasm of prostate: Secondary | ICD-10-CM | POA: Diagnosis not present

## 2023-04-16 DIAGNOSIS — E611 Iron deficiency: Secondary | ICD-10-CM

## 2023-04-16 DIAGNOSIS — Z794 Long term (current) use of insulin: Secondary | ICD-10-CM

## 2023-04-16 DIAGNOSIS — M2142 Flat foot [pes planus] (acquired), left foot: Secondary | ICD-10-CM

## 2023-04-16 DIAGNOSIS — G629 Polyneuropathy, unspecified: Secondary | ICD-10-CM

## 2023-04-16 DIAGNOSIS — E538 Deficiency of other specified B group vitamins: Secondary | ICD-10-CM

## 2023-04-16 DIAGNOSIS — Z0001 Encounter for general adult medical examination with abnormal findings: Secondary | ICD-10-CM

## 2023-04-16 DIAGNOSIS — R5383 Other fatigue: Secondary | ICD-10-CM

## 2023-04-16 DIAGNOSIS — E7800 Pure hypercholesterolemia, unspecified: Secondary | ICD-10-CM

## 2023-04-16 DIAGNOSIS — E559 Vitamin D deficiency, unspecified: Secondary | ICD-10-CM

## 2023-04-16 DIAGNOSIS — F17200 Nicotine dependence, unspecified, uncomplicated: Secondary | ICD-10-CM

## 2023-04-16 DIAGNOSIS — E1142 Type 2 diabetes mellitus with diabetic polyneuropathy: Secondary | ICD-10-CM | POA: Diagnosis not present

## 2023-04-16 DIAGNOSIS — M79671 Pain in right foot: Secondary | ICD-10-CM

## 2023-04-16 LAB — TESTOSTERONE: Testosterone: 292.09 ng/dL — ABNORMAL LOW (ref 300.00–890.00)

## 2023-04-16 LAB — BASIC METABOLIC PANEL
BUN: 18 mg/dL (ref 6–23)
CO2: 29 meq/L (ref 19–32)
Calcium: 8.8 mg/dL (ref 8.4–10.5)
Chloride: 102 meq/L (ref 96–112)
Creatinine, Ser: 1.06 mg/dL (ref 0.40–1.50)
GFR: 82.01 mL/min (ref >60.00)
Glucose, Bld: 243 mg/dL — ABNORMAL HIGH (ref 70–99)
Potassium: 4 meq/L (ref 3.5–5.1)
Sodium: 138 meq/L (ref 135–145)

## 2023-04-16 LAB — CBC WITH DIFFERENTIAL/PLATELET
Basophils Absolute: 0 10*3/uL (ref 0.0–0.1)
Basophils Relative: 0.4 % (ref 0.0–3.0)
Eosinophils Absolute: 0.3 10*3/uL (ref 0.0–0.7)
Eosinophils Relative: 4.3 % (ref 0.0–5.0)
HCT: 41.3 % (ref 39.0–52.0)
Hemoglobin: 13.7 g/dL (ref 13.0–17.0)
Lymphocytes Relative: 33.8 % (ref 12.0–46.0)
Lymphs Abs: 2.4 10*3/uL (ref 0.7–4.0)
MCHC: 33.2 g/dL (ref 30.0–36.0)
MCV: 90.5 fL (ref 78.0–100.0)
Monocytes Absolute: 0.4 10*3/uL (ref 0.1–1.0)
Monocytes Relative: 5.1 % (ref 3.0–12.0)
Neutro Abs: 4 10*3/uL (ref 1.4–7.7)
Neutrophils Relative %: 56.4 % (ref 43.0–77.0)
Platelets: 234 10*3/uL (ref 150.0–400.0)
RBC: 4.57 Mil/uL (ref 4.22–5.81)
RDW: 13.6 % (ref 11.5–15.5)
WBC: 7.1 10*3/uL (ref 4.0–10.5)

## 2023-04-16 LAB — URINALYSIS, ROUTINE W REFLEX MICROSCOPIC
Bilirubin Urine: NEGATIVE
Ketones, ur: NEGATIVE
Leukocytes,Ua: NEGATIVE
Nitrite: NEGATIVE
Specific Gravity, Urine: >=1.03 — AB (ref 1.000–1.030)
Total Protein, Urine: 30 — AB
Urine Glucose: >=1000 — AB
Urobilinogen, UA: 0.2 (ref 0.0–1.0)
pH: 6 (ref 5.0–8.0)

## 2023-04-16 LAB — HEPATIC FUNCTION PANEL
ALT: 14 U/L (ref 0–53)
AST: 16 U/L (ref 0–37)
Albumin: 4.1 g/dL (ref 3.5–5.2)
Alkaline Phosphatase: 109 U/L (ref 39–117)
Bilirubin, Direct: 0.1 mg/dL (ref 0.0–0.3)
Total Bilirubin: 0.5 mg/dL (ref 0.2–1.2)
Total Protein: 6.8 g/dL (ref 6.0–8.3)

## 2023-04-16 LAB — MICROALBUMIN / CREATININE URINE RATIO
Creatinine,U: 194.2 mg/dL
Microalb Creat Ratio: 10.1 mg/g (ref 0.0–30.0)
Microalb, Ur: 19.7 mg/dL — ABNORMAL HIGH (ref 0.0–1.9)

## 2023-04-16 LAB — LIPID PANEL
Cholesterol: 261 mg/dL — ABNORMAL HIGH (ref 0–200)
HDL: 43.5 mg/dL (ref >39.00)
LDL Cholesterol: 170 mg/dL — ABNORMAL HIGH (ref 0–99)
NonHDL: 217.96
Total CHOL/HDL Ratio: 6
Triglycerides: 238 mg/dL — ABNORMAL HIGH (ref 0.0–149.0)
VLDL: 47.6 mg/dL — ABNORMAL HIGH (ref 0.0–40.0)

## 2023-04-16 LAB — TSH: TSH: 1.55 u[IU]/mL (ref 0.35–5.50)

## 2023-04-16 LAB — IBC PANEL
Iron: 44 ug/dL (ref 42–165)
Saturation Ratios: 16.2 % — ABNORMAL LOW (ref 20.0–50.0)
TIBC: 271.6 ug/dL (ref 250.0–450.0)
Transferrin: 194 mg/dL — ABNORMAL LOW (ref 212.0–360.0)

## 2023-04-16 LAB — FERRITIN: Ferritin: 161.7 ng/mL (ref 22.0–322.0)

## 2023-04-16 LAB — PSA: PSA: 0.28 ng/mL (ref 0.10–4.00)

## 2023-04-16 LAB — HEMOGLOBIN A1C: Hgb A1c MFr Bld: 12.9 % — ABNORMAL HIGH (ref 4.6–6.5)

## 2023-04-16 LAB — VITAMIN B12: Vitamin B-12: 493 pg/mL (ref 211–911)

## 2023-04-16 LAB — VITAMIN D 25 HYDROXY (VIT D DEFICIENCY, FRACTURES): VITD: 11 ng/mL — ABNORMAL LOW (ref 30.00–100.00)

## 2023-04-16 MED ORDER — ROSUVASTATIN CALCIUM 40 MG PO TABS: 40.0000 mg | ORAL_TABLET | Freq: Every day | ORAL | 3 refills | Status: DC

## 2023-04-16 MED ORDER — SILDENAFIL CITRATE 100 MG PO TABS: 50.0000 mg | ORAL_TABLET | Freq: Every day | ORAL | 11 refills | Status: AC | PRN

## 2023-04-16 MED ORDER — GABAPENTIN 300 MG PO CAPS: 300.0000 mg | ORAL_CAPSULE | Freq: Three times a day (TID) | ORAL | 1 refills | Status: AC

## 2023-04-16 MED ORDER — DAPAGLIFLOZIN PROPANEDIOL 5 MG PO TABS: 5.0000 mg | ORAL_TABLET | Freq: Every day | ORAL | 3 refills | Status: DC

## 2023-04-16 MED ORDER — INSULIN LISPRO (1 UNIT DIAL) 100 UNIT/ML (KWIKPEN): PEN_INJECTOR | SUBCUTANEOUS | 3 refills | Status: DC

## 2023-04-16 MED ORDER — DEXCOM G7 SENSOR MISC: 1.0000 | 3 refills | Status: DC

## 2023-04-16 MED ORDER — BASAGLAR KWIKPEN 100 UNIT/ML ~~LOC~~ SOPN: 20.0000 [IU] | PEN_INJECTOR | Freq: Every day | SUBCUTANEOUS | 3 refills | Status: DC

## 2023-04-16 MED ORDER — INSULIN PEN NEEDLE 32G X 4 MM MISC: 1.0000 | Freq: Four times a day (QID) | 3 refills | Status: AC

## 2023-04-16 NOTE — Assessment & Plan Note (Signed)
Last vitamin D Lab Results  Component Value Date   VD25OH 11.00 (L) 04/16/2023   Low, to start oral replacement

## 2023-04-16 NOTE — Assessment & Plan Note (Signed)
Mild to mod, for viagra prn,  to f/u any worsening symptoms or concerns 

## 2023-04-16 NOTE — Patient Instructions (Signed)
Ok to increase the gabapentin to 300 mg at three times per day  Please take all new medication as prescribed - the viagra as needed  Please continue all other medications as before, and refills have been done if requested.  Please have the pharmacy call with any other refills you may need.  Please continue your efforts at being more active, low cholesterol diet, and weight control.  You are otherwise up to date with prevention measures today.  Please keep your appointments with your specialists as you may have planned  You will be contacted regarding the referral for: eye doctor, endocrinology, and podiatry (foot doctor)  Please go to the LAB at the blood drawing area for the tests to be done  You will be contacted by phone if any changes need to be made immediately.  Otherwise, you will receive a letter about your results with an explanation, but please check with MyChart first.  Please make an Appointment to return in 6 months, or sooner if needed

## 2023-04-16 NOTE — Assessment & Plan Note (Signed)
[facility] for lab as ordered including cbc

## 2023-04-16 NOTE — Assessment & Plan Note (Signed)
Lab Results  Component Value Date   LDLCALC 170 (H) 04/16/2023   uncontrolled pt to restart crestor 40 mg

## 2023-04-16 NOTE — Assessment & Plan Note (Signed)
**Note De-Identified Streb Obfuscation** Pt counsled to quit pt not ready

## 2023-04-16 NOTE — Assessment & Plan Note (Signed)
For podiatry referral 

## 2023-04-16 NOTE — Assessment & Plan Note (Signed)
Age and sex appropriate education and counseling updated with regular exercise and diet Referrals for preventative services - for eye exam referral Immunizations addressed - declines all Smoking counseling  - pt counsled to quit, pt not ready Evidence for depression or other mood disorder - none significant Most recent labs reviewed. I have personally reviewed and have noted: 1) the patient's medical and social history 2) The patient's current medications and supplements 3) The patient's height, weight, and BMI have been recorded in the chart

## 2023-04-16 NOTE — Assessment & Plan Note (Signed)
Worsening - for increased gabapentin 300 tid

## 2023-04-16 NOTE — Assessment & Plan Note (Signed)
Lab Results  Component Value Date   HGBA1C 12.9 (H) 04/16/2023   uncontrolled, pt to restart farxiga 5 mg every day, glargine 20 u every day, humalog SSI, and refer endo
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
## 2023-04-20 MED ORDER — TESTOSTERONE CYPIONATE 100 MG/ML IM SOLN: 200.0000 mg | INTRAMUSCULAR | 5 refills | Status: AC

## 2023-04-21 ENCOUNTER — Telehealth (HOSPITAL_COMMUNITY): Payer: Self-pay

## 2023-04-21 NOTE — Telephone Encounter (Signed)
 Pharmacy Patient Advocate Encounter   Received notification from CoverMyMeds that prior authorization for Testosterone  Cypionate 100 mg/1ml is required/requested.   Insurance verification completed.   The patient is insured through [facility] .   Per test claim: PA required; PA started via CoverMyMeds. KEY [id number] . Waiting for clinical questions to populate.

## 2023-05-14 ENCOUNTER — Other Ambulatory Visit (HOSPITAL_COMMUNITY): Payer: Self-pay

## 2023-05-14 ENCOUNTER — Telehealth: Payer: Self-pay

## 2023-05-14 NOTE — Telephone Encounter (Signed)
 PA request expired. PA request has been  Resubmitted . New Encounter created for follow up. For additional info see Pharmacy Prior Auth telephone encounter from 05/14/23.

## 2023-05-14 NOTE — Telephone Encounter (Signed)
 Pharmacy Patient Advocate Encounter  Received notification from [facility] that Prior Authorization for Testosterone Cypionate 100MG /ML intramuscular solution  has been DENIED.  Full denial letter will be uploaded to the media tab. See denial reason below.   PA #/Case ID/Reference #: [id number]

## 2023-05-14 NOTE — Telephone Encounter (Signed)
 Pharmacy Patient Advocate Encounter   Received notification from [employer]base that prior authorization for Testosterone Cypionate 100MG /ML intramuscular solution is required/requested.   Insurance verification completed.   The patient is insured through [facility] .   Per test claim: PA required; PA submitted to above mentioned insurance via CoverMyMeds Key/confirmation #/EOC [facility] Status is pending

## 2023-08-12 ENCOUNTER — Encounter: Payer: Self-pay | Admitting: "Endocrinology

## 2023-08-12 ENCOUNTER — Ambulatory Visit: Admitting: "Endocrinology

## 2023-08-12 VITALS — BP 110/80 | HR 84 | Ht 75.0 in | Wt 205.0 lb

## 2023-08-12 DIAGNOSIS — E782 Mixed hyperlipidemia: Secondary | ICD-10-CM | POA: Diagnosis not present

## 2023-08-12 DIAGNOSIS — Z794 Long term (current) use of insulin: Secondary | ICD-10-CM | POA: Diagnosis not present

## 2023-08-12 DIAGNOSIS — E1165 Type 2 diabetes mellitus with hyperglycemia: Secondary | ICD-10-CM | POA: Diagnosis not present

## 2023-08-12 LAB — POCT GLYCOSYLATED HEMOGLOBIN (HGB A1C): Hemoglobin A1C: 12.7 % — AB (ref 4.0–5.6)

## 2023-08-12 MED ORDER — FREESTYLE LIBRE 3 PLUS SENSOR MISC: 3 refills | Status: AC

## 2023-08-12 NOTE — Patient Instructions (Signed)
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
## 2023-08-26 ENCOUNTER — Ambulatory Visit: Admitting: "Endocrinology

## 2023-08-26 ENCOUNTER — Encounter: Payer: Self-pay | Admitting: "Endocrinology

## 2023-08-26 VITALS — BP 140/80 | HR 65 | Ht 75.0 in | Wt 204.0 lb

## 2023-08-26 DIAGNOSIS — E1165 Type 2 diabetes mellitus with hyperglycemia: Secondary | ICD-10-CM

## 2023-08-26 DIAGNOSIS — Z794 Long term (current) use of insulin: Secondary | ICD-10-CM | POA: Diagnosis not present

## 2023-08-26 DIAGNOSIS — E782 Mixed hyperlipidemia: Secondary | ICD-10-CM

## 2023-08-26 MED ORDER — EMPAGLIFLOZIN 10 MG PO TABS: 10.0000 mg | ORAL_TABLET | Freq: Every day | ORAL | 0 refills | Status: AC

## 2023-08-26 MED ORDER — ROSUVASTATIN CALCIUM 40 MG PO TABS: 40.0000 mg | ORAL_TABLET | Freq: Every day | ORAL | 1 refills | Status: AC

## 2023-08-26 MED ORDER — INSULIN LISPRO (1 UNIT DIAL) 100 UNIT/ML (KWIKPEN): PEN_INJECTOR | SUBCUTANEOUS | 3 refills | Status: AC

## 2023-08-26 MED ORDER — FREESTYLE LIBRE 3 PLUS SENSOR MISC: 3 refills | Status: AC
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
## 2023-08-26 NOTE — Patient Instructions (Signed)
 Will recommend the following: Basaglar  32-34 units qam Humalog  three times a day 15 min before meals based on blood sugar as follows: 151 - 170: 1 unit 171 - 190: 2 units 191 - 210: 3 units 211 - 230: 4 units 231 - 250: 5 units 251 - 270: 6 units 271 - 290: 7 units 291 - 310: 8 units 311 - 330: 9 units 331 - 350: 10 units 351 - 370: 11 units 371 - 390: 12 units 391 and above: 13 units

## 2023-09-06 ENCOUNTER — Ambulatory Visit: Admitting: Dietician

## 2023-09-28 ENCOUNTER — Ambulatory Visit: Admitting: "Endocrinology

## 2023-10-14 ENCOUNTER — Ambulatory Visit: Payer: BLUE CROSS/BLUE SHIELD | Admitting: Internal Medicine

## 2023-10-25 ENCOUNTER — Ambulatory Visit: Admitting: Dietician

## 2024-03-09 ENCOUNTER — Encounter (HOSPITAL_COMMUNITY): Payer: Self-pay | Admitting: Emergency Medicine

## 2024-03-09 ENCOUNTER — Emergency Department (HOSPITAL_COMMUNITY)
Admission: EM | Admit: 2024-03-09 | Discharge: 2024-03-10 | Disposition: A | Discharge status: Home / Self Care | Payer: Self-pay | Attending: Emergency Medicine | Admitting: Emergency Medicine

## 2024-03-09 ENCOUNTER — Other Ambulatory Visit: Payer: Self-pay

## 2024-03-09 DIAGNOSIS — J40 Bronchitis, not specified as acute or chronic: Secondary | ICD-10-CM | POA: Insufficient documentation

## 2024-03-09 DIAGNOSIS — Z794 Long term (current) use of insulin: Secondary | ICD-10-CM | POA: Insufficient documentation

## 2024-03-09 DIAGNOSIS — I10 Essential (primary) hypertension: Secondary | ICD-10-CM | POA: Insufficient documentation

## 2024-03-09 DIAGNOSIS — Z7982 Long term (current) use of aspirin: Secondary | ICD-10-CM | POA: Insufficient documentation

## 2024-03-09 DIAGNOSIS — E119 Type 2 diabetes mellitus without complications: Secondary | ICD-10-CM | POA: Insufficient documentation

## 2024-03-09 DIAGNOSIS — Z8673 Personal history of transient ischemic attack (TIA), and cerebral infarction without residual deficits: Secondary | ICD-10-CM | POA: Insufficient documentation

## 2024-03-09 DIAGNOSIS — E860 Dehydration: Secondary | ICD-10-CM | POA: Insufficient documentation

## 2024-03-09 DIAGNOSIS — Z7984 Long term (current) use of oral hypoglycemic drugs: Secondary | ICD-10-CM | POA: Insufficient documentation

## 2024-03-09 LAB — CBC WITH DIFFERENTIAL/PLATELET
Abs Immature Granulocytes: 0.06 K/uL (ref 0.00–0.07)
Basophils Absolute: 0 K/uL (ref 0.0–0.1)
Basophils Relative: 0 %
Eosinophils Absolute: 0.2 K/uL (ref 0.0–0.5)
Eosinophils Relative: 3 %
HCT: 40.9 % (ref 39.0–52.0)
Hemoglobin: 13.9 g/dL (ref 13.0–17.0)
Immature Granulocytes: 1 %
Lymphocytes Relative: 28 %
Lymphs Abs: 2.6 K/uL (ref 0.7–4.0)
MCH: 30 pg (ref 26.0–34.0)
MCHC: 34 g/dL (ref 30.0–36.0)
MCV: 88.1 fL (ref 80.0–100.0)
Monocytes Absolute: 0.6 K/uL (ref 0.1–1.0)
Monocytes Relative: 6 %
Neutro Abs: 6.1 K/uL (ref 1.7–7.7)
Neutrophils Relative %: 62 %
Platelets: 229 K/uL (ref 150–400)
RBC: 4.64 MIL/uL (ref 4.22–5.81)
RDW: 12.6 % (ref 11.5–15.5)
WBC: 9.6 K/uL (ref 4.0–10.5)
nRBC: 0 % (ref 0.0–0.2)

## 2024-03-09 LAB — RESP PANEL BY RT-PCR (RSV, FLU A&B, COVID)  RVPGX2
Influenza A by PCR: NEGATIVE
Influenza B by PCR: NEGATIVE
Resp Syncytial Virus by PCR: NEGATIVE
SARS Coronavirus 2 by RT PCR: NEGATIVE

## 2024-03-09 LAB — COMPREHENSIVE METABOLIC PANEL WITH GFR
ALT: 21 U/L (ref 0–44)
AST: 26 U/L (ref 15–41)
Albumin: 3.5 g/dL (ref 3.5–5.0)
Alkaline Phosphatase: 118 U/L (ref 38–126)
Anion gap: 13 (ref 5–15)
BUN: 30 mg/dL — ABNORMAL HIGH (ref 6–20)
CO2: 27 mmol/L (ref 22–32)
Calcium: 9.5 mg/dL (ref 8.9–10.3)
Chloride: 99 mmol/L (ref 98–111)
Creatinine, Ser: 1.65 mg/dL — ABNORMAL HIGH (ref 0.61–1.24)
GFR, Estimated: 50 mL/min — ABNORMAL LOW
Glucose, Bld: 182 mg/dL — ABNORMAL HIGH (ref 70–99)
Potassium: 4.8 mmol/L (ref 3.5–5.1)
Sodium: 139 mmol/L (ref 135–145)
Total Bilirubin: 0.4 mg/dL (ref 0.0–1.2)
Total Protein: 7.6 g/dL (ref 6.5–8.1)

## 2024-03-09 NOTE — ED Triage Notes (Signed)
 Patient reports persistent cough with chest congestion this week .

## 2024-03-10 ENCOUNTER — Emergency Department (HOSPITAL_COMMUNITY): Payer: Self-pay

## 2024-03-10 MED ORDER — ALBUTEROL SULFATE HFA 108 (90 BASE) MCG/ACT IN AERS
2.0000 | INHALATION_SPRAY | Freq: Once | RESPIRATORY_TRACT | Status: AC
  Administered 2024-03-10: 2 via RESPIRATORY_TRACT
  Filled 2024-03-10: qty 6.7

## 2024-03-10 MED ORDER — LACTATED RINGERS IV BOLUS
1000.0000 mL | Freq: Once | INTRAVENOUS | Status: AC
  Administered 2024-03-10: 1000 mL via INTRAVENOUS

## 2024-03-10 MED ORDER — DEXAMETHASONE 4 MG PO TABS
10.0000 mg | ORAL_TABLET | Freq: Once | ORAL | Status: AC
  Administered 2024-03-10: 10 mg via ORAL
  Filled 2024-03-10: qty 3

## 2024-03-10 MED ORDER — ALBUTEROL SULFATE HFA 108 (90 BASE) MCG/ACT IN AERS: 2.0000 | INHALATION_SPRAY | RESPIRATORY_TRACT | 0 refills | Status: AC | PRN

## 2024-03-10 NOTE — ED Provider Notes (Addendum)
 " [facility] Provider Note   CSN: [id number] Arrival date & time: 03/09/24  8065     Patient presents with: Cough / Chest Congestion    Barry Phillips is a 53 y.o. male.   HPI     This a 53 year old male who presents with cough and chest congestion.  Patient reports onset of symptoms last Thursday.  Reports that he was seen and evaluated urgent care earlier this week and told that he did not have the flu.  He was given some medications but continues to have cough and chest congestion.  Reports chills but no documented fevers.  Also report some dizziness.  Feels he may be dehydrated.  He is not having any active nausea or vomiting at the moment but did have some vomiting and diarrhea early in his illness.  Prior to Admission medications  Medication Sig Start Date End Date Taking? Authorizing Provider  albuterol  (VENTOLIN  HFA) 108 (90 Base) MCG/ACT inhaler Inhale 2 puffs into the lungs every 4 (four) hours as needed for wheezing or shortness of breath. 03/10/24  Yes Kodie Pick, Charmaine FALCON, MD  aspirin 81 MG chewable tablet Chew 81 mg by mouth daily.    [provider]  Continuous Glucose Sensor (FREESTYLE LIBRE 3 PLUS SENSOR) MISC Change sensor every 15 days. Patient not taking: Reported on 08/26/2023 08/12/23   Motwani, Komal, MD  Continuous Glucose Sensor (FREESTYLE LIBRE 3 PLUS SENSOR) MISC Change sensor every 15 days. 08/26/23   Motwani, Komal, MD  empagliflozin  (JARDIANCE ) 10 MG TABS tablet Take 1 tablet (10 mg total) by mouth daily before breakfast. 08/26/23   Motwani, Obadiah, MD  gabapentin  (NEURONTIN ) 300 MG capsule Take 1 capsule (300 mg total) by mouth 3 (three) times daily. 04/16/23   Norleen Lynwood ORN, MD  Insulin  Glargine (BASAGLAR  KWIKPEN) 100 UNIT/ML Inject 20 Units into the skin daily. 04/16/23   Norleen Lynwood ORN, MD  insulin  lispro (HUMALOG  KWIKPEN) 100 UNIT/ML KwikPen Max daily 30 units 08/26/23   Motwani, Obadiah, MD  Insulin  Pen  Needle 32G X 4 MM MISC 1 Device by Does not apply route in the morning, at noon, in the evening, and at bedtime. 04/16/23   Norleen Lynwood ORN, MD  Lancets (BD LANCET ULTRAFINE 30G) MISC : Check sugar twice a day 05/13/21   Norleen Lynwood ORN, MD  rosuvastatin  (CRESTOR ) 40 MG tablet Take 1 tablet (40 mg total) by mouth daily. 08/26/23   Motwani, Komal, MD  sildenafil  (VIAGRA ) 100 MG tablet Take 0.5-1 tablets (50-100 mg total) by mouth daily as needed for erectile dysfunction. 04/16/23   Norleen Lynwood ORN, MD  testosterone  cypionate (DEPO-TESTOSTERONE ) 100 MG/ML injection Inject 2 mLs (200 mg total) into the muscle every 14 (fourteen) days. For IM use only 04/20/23   Norleen Lynwood ORN, MD    Allergies: Patient has no known allergies.    Review of Systems  Constitutional:  Positive for chills. Negative for fever.  Respiratory:  Positive for cough. Negative for shortness of breath.   Gastrointestinal:  Negative for diarrhea, nausea and vomiting.  Neurological:  Positive for dizziness.  All other systems reviewed and are negative.   Updated Vital Signs BP 130/83   Pulse 88   Temp 97.8 F (36.6 C) (Oral)   Resp 18   Ht 1.905 m (6' 3)   Wt 93 kg   SpO2 99%   BMI 25.63 kg/m   Physical Exam Vitals and nursing note reviewed.  Constitutional:  Appearance: He is well-developed. He is not ill-appearing.  HENT:     Head: Normocephalic and atraumatic.  Eyes:     Pupils: Pupils are equal, round, and reactive to light.  Cardiovascular:     Rate and Rhythm: Normal rate and regular rhythm.     Heart sounds: Normal heart sounds. No murmur heard. Pulmonary:     Effort: Pulmonary effort is normal. No respiratory distress.     Breath sounds: Wheezing present.     Comments: Occasional expiratory wheeze Abdominal:     General: Bowel sounds are normal.     Palpations: Abdomen is soft.     Tenderness: There is no abdominal tenderness. There is no rebound.  Musculoskeletal:     Cervical back: Neck supple.   Lymphadenopathy:     Cervical: No cervical adenopathy.  Skin:    General: Skin is warm and dry.  Neurological:     Mental Status: He is alert and oriented to person, place, and time.  Psychiatric:        Mood and Affect: Mood normal.     (all labs ordered are listed, but only abnormal results are displayed) Labs Reviewed  COMPREHENSIVE METABOLIC PANEL WITH GFR - Abnormal; Notable for the following components:      Result Value   Glucose, Bld 182 (*)    BUN 30 (*)    Creatinine, Ser 1.65 (*)    GFR, Estimated 50 (*)    All other components within normal limits  RESP PANEL BY RT-PCR (RSV, FLU A&B, COVID)  [id number]  CBC WITH DIFFERENTIAL/PLATELET    EKG: None  Radiology: DG Chest 2 View Result Date: 03/10/2024 EXAM: 2 VIEW(S) XRAY OF THE CHEST 03/10/2024 12:52:46 AM COMPARISON: 12 / 4 / 23 CLINICAL HISTORY: cough FINDINGS: LUNGS AND PLEURA: No focal pulmonary opacity. No pleural effusion. No pneumothorax. HEART AND MEDIASTINUM: No acute abnormality of the cardiac and mediastinal silhouettes. BONES AND SOFT TISSUES: No acute osseous abnormality. IMPRESSION: 1. No acute cardiopulmonary process. Electronically signed by: Norman Gatlin MD 03/10/2024 12:59 AM EST RP Workstation: [id number]     .Critical Care  Performed by: Bari Charmaine FALCON, MD Authorized by: Bari Charmaine FALCON, MD   Critical care provider statement:    Critical care time (minutes):  31   Critical care was necessary to treat or prevent imminent or life-threatening deterioration of the following conditions: Dehydration.   Critical care was time spent personally by me on the following activities:  Development of treatment plan with patient or surrogate, discussions with consultants, evaluation of patient's response to treatment, examination of patient, ordering and review of laboratory studies, ordering and review of radiographic studies, ordering and performing treatments and interventions, pulse oximetry,  re-evaluation of patient's condition and review of old charts    Medications Ordered in the ED  albuterol  (VENTOLIN  HFA) 108 (90 Base) MCG/ACT inhaler 2 puff (2 puffs Inhalation Given 03/10/24 0025)  dexamethasone  (DECADRON ) tablet 10 mg (10 mg Oral Given 03/10/24 0214)  lactated ringers  bolus 1,000 mL (0 mLs Intravenous Stopped 03/10/24 0406)  lactated ringers  bolus 1,000 mL (1,000 mLs Intravenous New Bag/Given 03/10/24 0416)    Clinical Course as of 03/10/24 0523  Fri Mar 10, 2024  0139 At time of discharge.  Orthostatics noted to be hypotension with standing.  Patient felt dizzy.  Will give a liter of fluids and reassess. [CH]  0430 Patient states he feels much better after 1 L of fluids.  By numbers, still orthostatic.  Will provide with  1 additional liter.  Will plan for discharge after this. [CH]    Clinical Course User Index [CH] Damaria Vachon, Charmaine FALCON, MD                                 Medical Decision Making Amount and/or Complexity of Data Reviewed Labs: ordered. Radiology: ordered.  Risk Prescription drug management.   This patient presents to the ED for concern of cough, congestion, this involves an extensive number of treatment options, and is a complaint that carries with it a high risk of complications and morbidity.  I considered the following differential and admission for this acute, potentially life threatening condition.  The differential diagnosis includes viral illness such as COVID or influenza, bronchitis, pneumonia  MDM:    This is a 53 year old male who presents with ongoing cough and congestion.  He is nontoxic.  Afebrile.  Satting 100% on room air.  He does have an occasional wheeze but is in no respiratory distress.  Current smoker.  COVID and flu testing is negative.  Chest x-ray shows no evidence of pneumothorax or pneumonia.  Labs are largely reassuring.  Patient given a dose of Decadron  and an inhaler.  Suspect acute bronchitis.  Will treat as  such.  At discharge, patient's orthostatics noted to be positive.  Was given fluids x 2 incremental improvement of dizziness.  (Labs, imaging, consults)  Labs: I Ordered, and personally interpreted labs.  The pertinent results include: CBC, CMP COVID, flu  Imaging Studies ordered: I ordered imaging studies including chest x-ray I independently visualized and interpreted imaging. I agree with the radiologist interpretation  Additional history obtained from chart review.  External records from outside source obtained and reviewed including prior evaluations  Cardiac Monitoring: The patient was not maintained on a cardiac monitor.  If on the cardiac monitor, I personally viewed and interpreted the cardiac monitored which showed an underlying rhythm of: N/A  Reevaluation: After the interventions noted above, I reevaluated the patient and found that they have :stayed the same  Social Determinants of Health:  lives independently  Disposition: Discharge  Co morbidities that complicate the patient evaluation  Past Medical History:  Diagnosis Date   Allergy    mild   Diabetes mellitus without complication (HCC)    GERD (gastroesophageal reflux disease)    Hyperlipidemia    Hypertension    [place] stroke (HCC) 07/21/2018   Per feb 2020 MRI   Stroke [facility])    [place]ar  stroke     Medicines Meds ordered this encounter  Medications   albuterol  (VENTOLIN  HFA) 108 (90 Base) MCG/ACT inhaler 2 puff   albuterol  (VENTOLIN  HFA) 108 (90 Base) MCG/ACT inhaler    Sig: Inhale 2 puffs into the lungs every 4 (four) hours as needed for wheezing or shortness of breath.    Dispense:  1 each    Refill:  0   dexamethasone  (DECADRON ) tablet 10 mg   lactated ringers  bolus 1,000 mL   lactated ringers  bolus 1,000 mL    I have reviewed the patients home medicines and have made adjustments as needed  Problem List / ED Course: Problem List Items Addressed This Visit   None Visit Diagnoses        Bronchitis    -  Primary     Dehydration                    Final diagnoses:  Bronchitis  Dehydration    ED Discharge Orders          Ordered    albuterol  (VENTOLIN  HFA) 108 (90 Base) MCG/ACT inhaler  Every 4 hours PRN        03/10/24 0115               Bari Charmaine FALCON, MD 03/10/24 0120    Bari Charmaine FALCON, MD 03/10/24 [phone]  "

## 2024-03-10 NOTE — Discharge Instructions (Addendum)
 You were seen today for ongoing upper respiratory symptoms.  Your chest x-ray does not show any evidence of pneumonia.  You likely have some bronchitis.  Trial the inhaler to see if this helps with the cough.  Avoid smoking.  You were given a dose of steroids.  Follow-up with your primary doctor.  Make sure that you are drinking plenty of electrolyte rich fluids.

## 2024-04-03 ENCOUNTER — Ambulatory Visit: Admitting: "Endocrinology

## 2024-04-18 ENCOUNTER — Other Ambulatory Visit: Payer: Self-pay

## 2024-04-18 ENCOUNTER — Ambulatory Visit: Admitting: "Endocrinology

## 2024-04-18 ENCOUNTER — Other Ambulatory Visit: Payer: Self-pay | Admitting: Internal Medicine

## 2024-05-03 ENCOUNTER — Ambulatory Visit: Admitting: "Endocrinology
